# Patient Record
Sex: Male | Born: 1995 | Race: White | Hispanic: No | Marital: Single | Smoking: Current every day smoker
Health system: Southern US, Community
[De-identification: ages and names within clinical notes are randomized; demographics above are authoritative.]

## PROBLEM LIST (undated history)

## (undated) DIAGNOSIS — H539 Unspecified visual disturbance: Secondary | ICD-10-CM

## (undated) DIAGNOSIS — E669 Obesity, unspecified: Secondary | ICD-10-CM

## (undated) DIAGNOSIS — J45909 Unspecified asthma, uncomplicated: Secondary | ICD-10-CM

## (undated) DIAGNOSIS — F909 Attention-deficit hyperactivity disorder, unspecified type: Secondary | ICD-10-CM

## (undated) DIAGNOSIS — F32A Depression, unspecified: Secondary | ICD-10-CM

## (undated) DIAGNOSIS — E785 Hyperlipidemia, unspecified: Principal | ICD-10-CM

## (undated) DIAGNOSIS — T7840XA Allergy, unspecified, initial encounter: Secondary | ICD-10-CM

## (undated) DIAGNOSIS — F329 Major depressive disorder, single episode, unspecified: Secondary | ICD-10-CM

## (undated) DIAGNOSIS — R51 Headache: Secondary | ICD-10-CM

## (undated) HISTORY — DX: Depression, unspecified: F32.A

## (undated) HISTORY — PX: ADENOIDECTOMY: SUR15

## (undated) HISTORY — DX: Hyperlipidemia, unspecified: E78.5

## (undated) HISTORY — PX: TONSILLECTOMY: SUR1361

## (undated) HISTORY — DX: Major depressive disorder, single episode, unspecified: F32.9

---
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
## 2013-05-07 IMAGING — US US SOFT TISSUE HEAD/NECK
1 series · 14 of 25 positions shown · non-contrast
Comparison: None.

CLINICAL DATA: Goiter.

THYROID ULTRASOUND
TECHNIQUE: Ultrasound examination of the thyroid gland and adjacent
soft tissues was performed.

[Series 1: us soft tissue head/neck · 0.07mm/px · 14 of 45 slices shown]
[im 1/45]
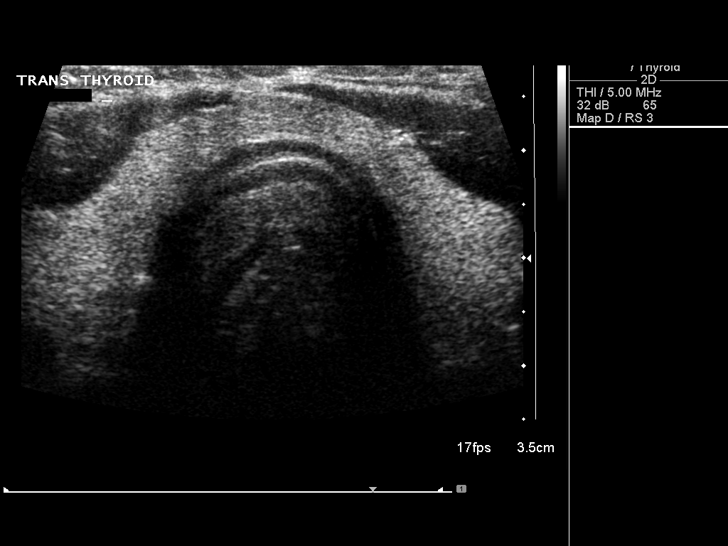
[im 4/45]
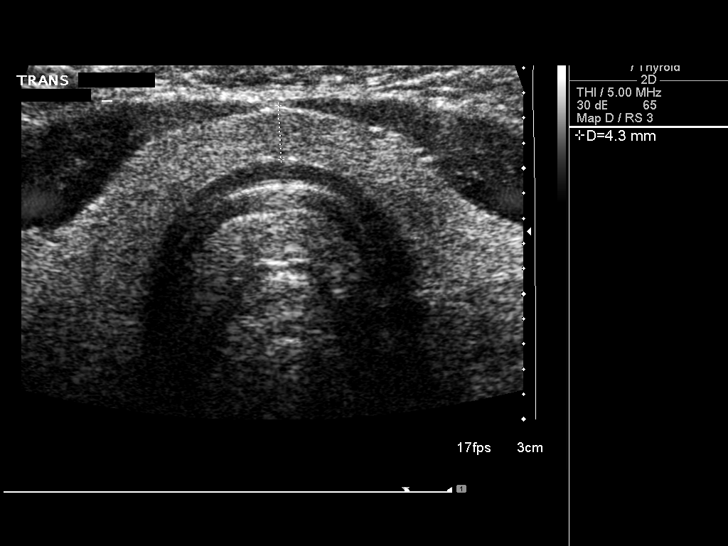
[im 8/45]
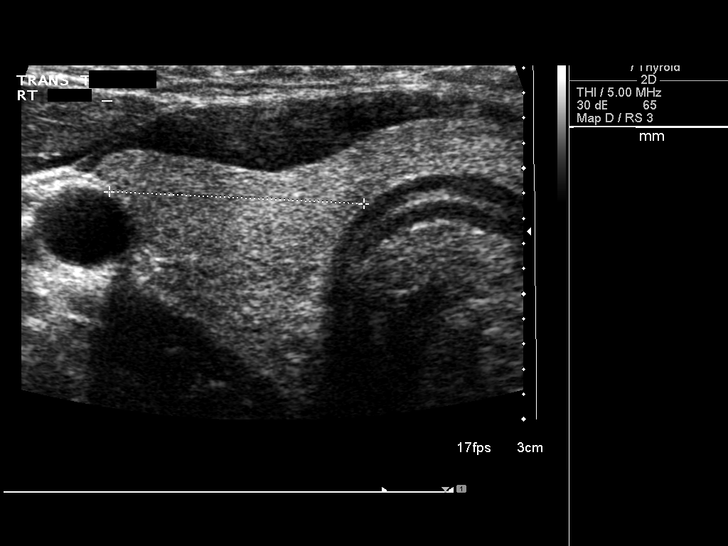
[im 12/45]
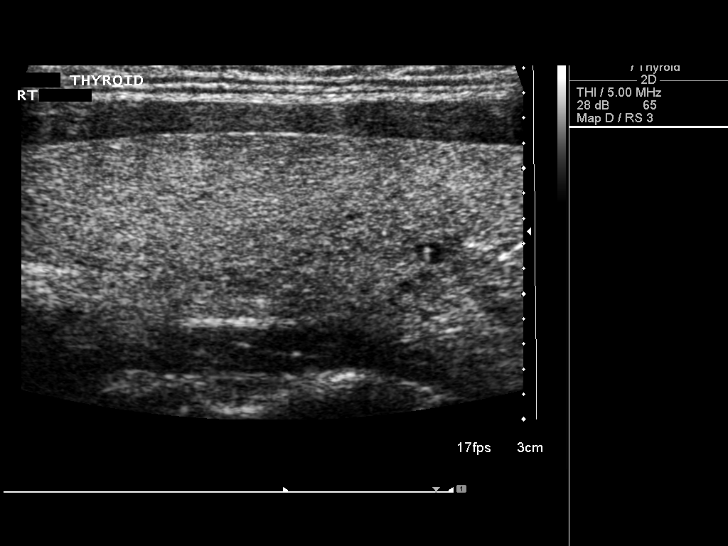
[im 15/45]
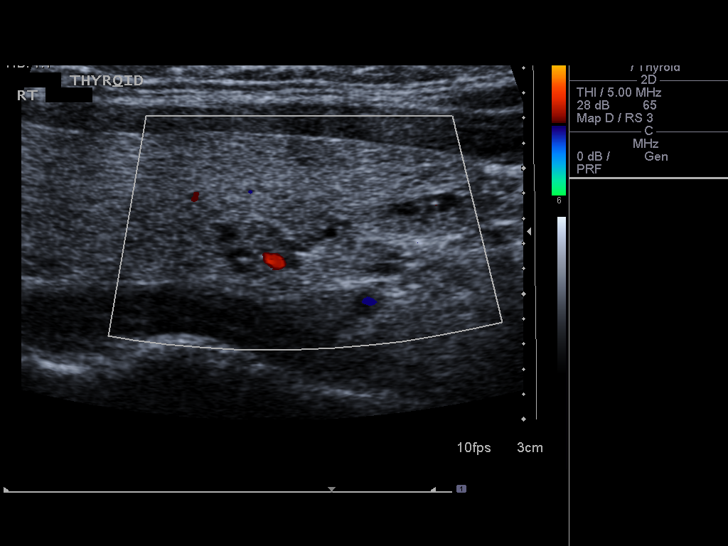
[im 17/45]
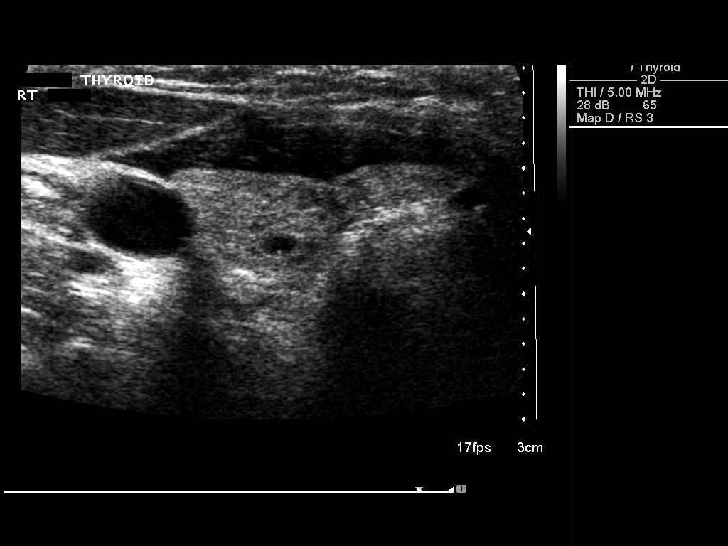
[im 21/45]
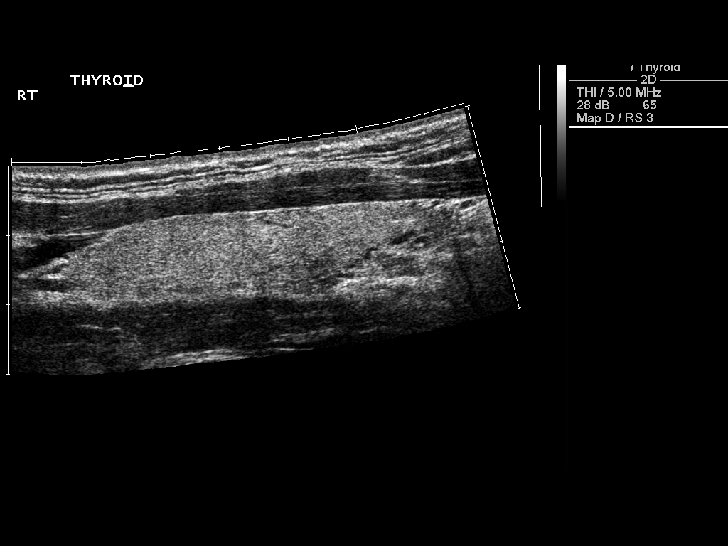
[im 24/45]
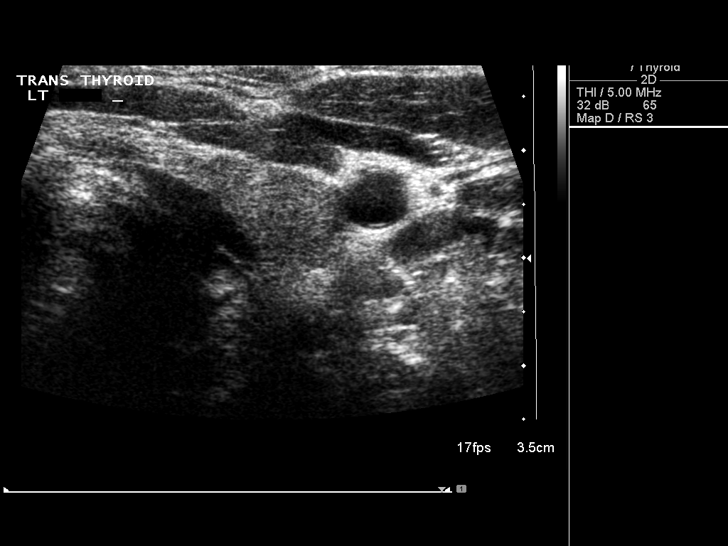
[im 28/45]
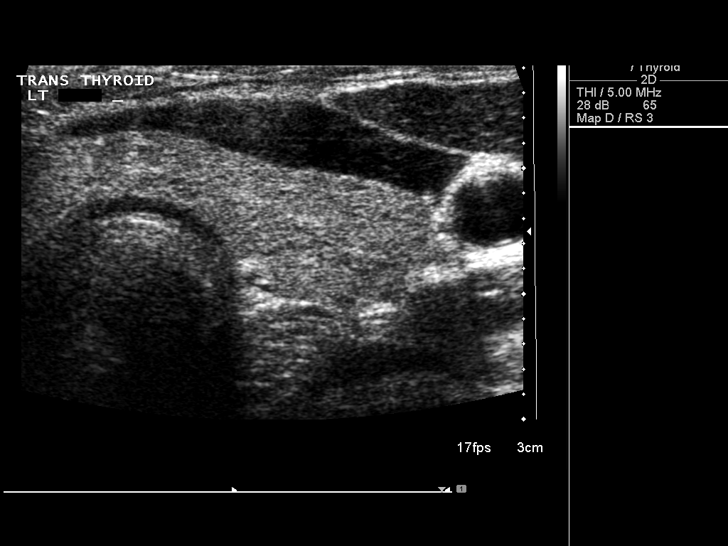
[im 30/45]
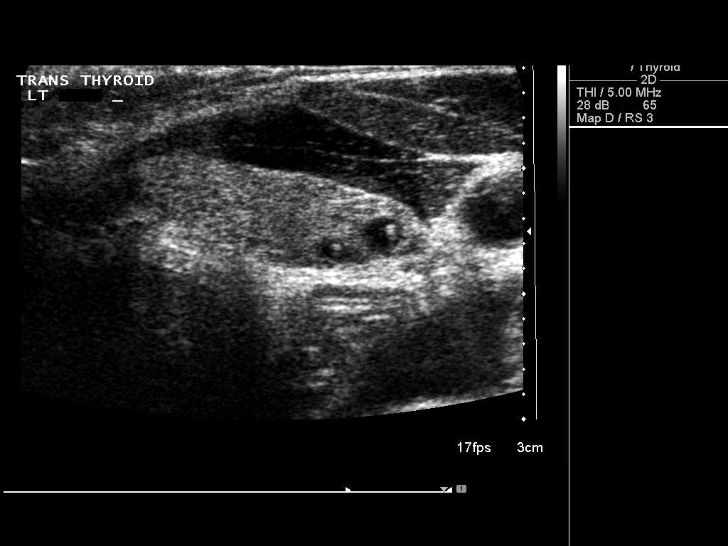
[im 34/45]
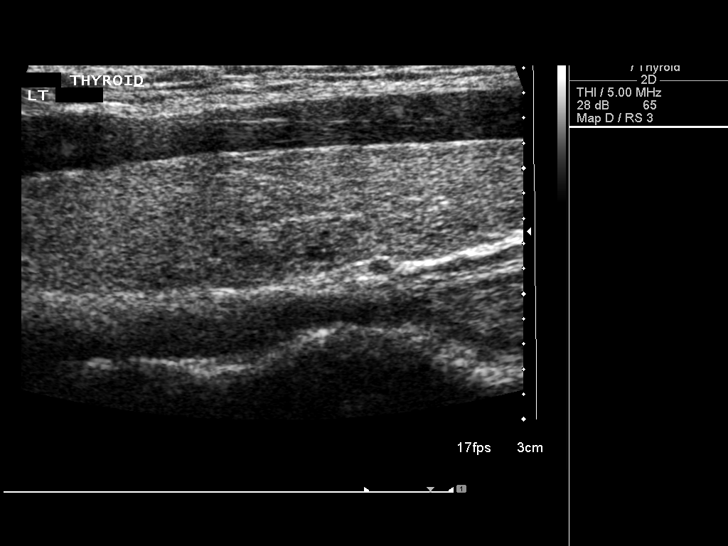
[im 37/45]
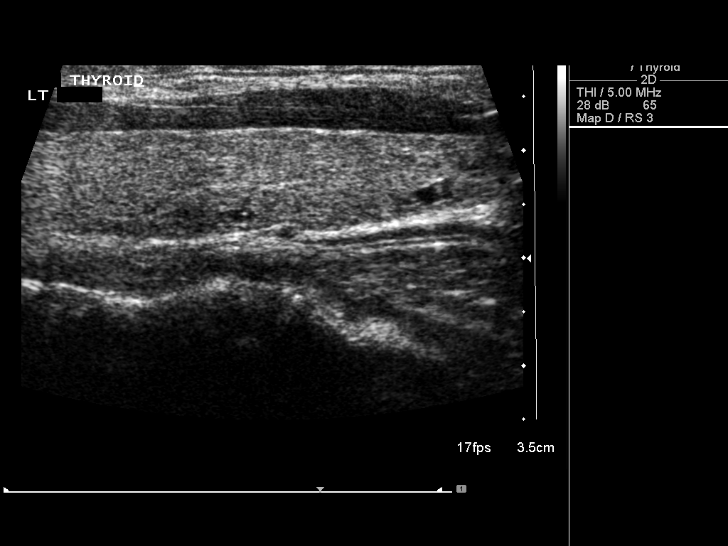
[im 41/45]
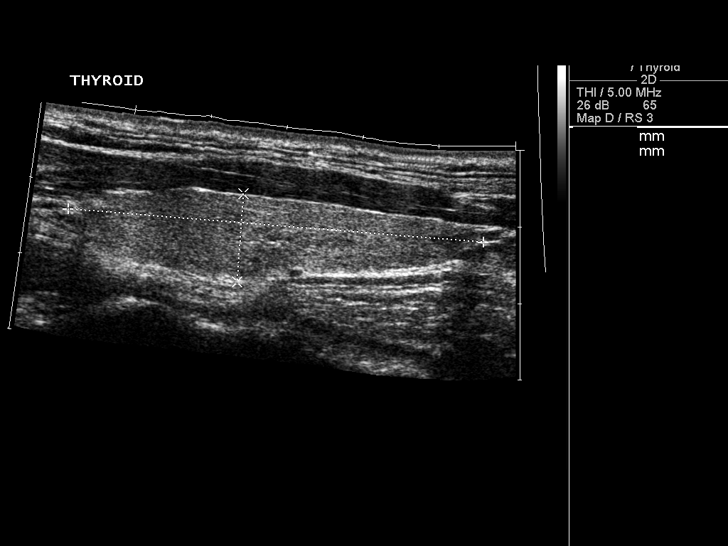
[im 45/45]
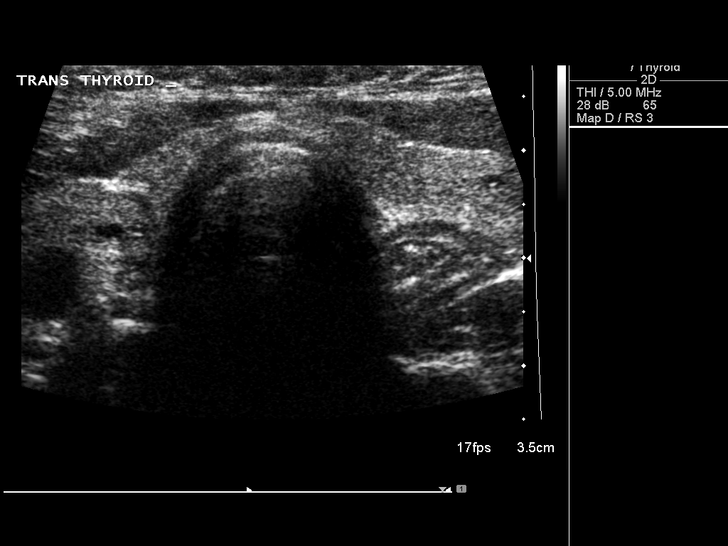

[14 of 25 positions shown; findings below may reference images not displayed]

FINDINGS: Right thyroid lobe:  Measures 5.8 x 1.3 x 2.2 cm and is homogeneous
in echotexture.
Left thyroid lobe:  Measures 5.4 x 1.2 x 1.6 cm and is homogeneous
in echotexture.
Isthmus:  Measures 4 mm.

Focal nodules:  There are scattered cystic nodules in the lower
poles bilaterally, measuring 5 mm or less in size.  No solid
nodules.

Lymphadenopathy:  None visualized.
IMPRESSION: Mildly enlarged thyroid.

## 2014-07-01 ENCOUNTER — Ambulatory Visit: Payer: Self-pay | Admitting: Medical

## 2014-07-07 ENCOUNTER — Telehealth: Payer: Self-pay | Admitting: *Deleted

## 2014-07-07 NOTE — Telephone Encounter (Signed)
Unable to reach patient at time of call- mother answered and stated he was sleeping (just got home from college).

## 2014-07-08 ENCOUNTER — Ambulatory Visit (INDEPENDENT_AMBULATORY_CARE_PROVIDER_SITE_OTHER): Payer: PRIVATE HEALTH INSURANCE | Admitting: Medical

## 2014-07-08 ENCOUNTER — Encounter: Payer: Self-pay | Admitting: Medical

## 2014-07-08 VITALS — BP 137/82 | HR 84 | Temp 98.4°F | Ht 74.75 in | Wt 304.6 lb

## 2014-07-08 DIAGNOSIS — J301 Allergic rhinitis due to pollen: Secondary | ICD-10-CM

## 2014-07-08 DIAGNOSIS — Z0189 Encounter for other specified special examinations: Secondary | ICD-10-CM | POA: Diagnosis not present

## 2014-07-08 DIAGNOSIS — J45998 Other asthma: Secondary | ICD-10-CM | POA: Insufficient documentation

## 2014-07-08 DIAGNOSIS — F32A Depression, unspecified: Secondary | ICD-10-CM

## 2014-07-08 DIAGNOSIS — F329 Major depressive disorder, single episode, unspecified: Secondary | ICD-10-CM | POA: Insufficient documentation

## 2014-07-08 DIAGNOSIS — Z0000 Encounter for general adult medical examination without abnormal findings: Secondary | ICD-10-CM | POA: Insufficient documentation

## 2014-07-08 DIAGNOSIS — G43809 Other migraine, not intractable, without status migrainosus: Secondary | ICD-10-CM

## 2014-07-08 DIAGNOSIS — Z87892 Personal history of anaphylaxis: Secondary | ICD-10-CM

## 2014-07-08 DIAGNOSIS — J309 Allergic rhinitis, unspecified: Secondary | ICD-10-CM | POA: Insufficient documentation

## 2014-07-08 NOTE — Assessment & Plan Note (Signed)
History of migraine ha in past. Used to see Dr.  Asa LenteElliot Lewitt 3 years ago. Pt used to be on Topamax 3 years. Pt migraine ha are random now. Over last year got 4-5 migraine last year. Pt just took a nap and ibuprofen. Pt describes light and sound sensitivity like migraine. Currenlty stable.

## 2014-07-08 NOTE — Assessment & Plan Note (Signed)
hx of asthma. Last flare per pt mom was 2 yrs ago. But that wheezing episode during anaphylactic reaction when exposed to powdered eggs. But otherwise last wheezing episode was about 10 years ago. Pt never saw allergist to investigate what exactly allergy was in the powdered.

## 2014-07-08 NOTE — Assessment & Plan Note (Signed)
Allergies- yr round but mostly in the Spring. Worst with Oak and [place] trees.

## 2014-07-08 NOTE — Assessment & Plan Note (Addendum)
Will put in future labs to get done fasting. Recommend to diet and exercise to loose weight. Will wait on prior provider records to see if you need any vaccines.  On follow up will review again your chronic current conditions before you leave for college and then may prescribe meds. Currently all conditions appear to be stable did not make management changes.  Note new pt and stable conditions. He did not need rx. So documented his prior problems but did wellness exam.

## 2014-07-08 NOTE — Assessment & Plan Note (Signed)
One time to powerdered eggs. I offered referal to allergist pt declined. Mom and pt decline epipen presently.

## 2014-07-08 NOTE — Progress Notes (Signed)
Subjective:    Patient ID: Leonard Obrien, male    DOB: 1995-04-12, 19 y.o.   MRN: [id number]  HPI   I have reviewed pt PMH, PSH, FH, Social History and Surgical History  Pt in with mom.   Allergies- yr round but mostly in the Spring. Worst with Oak and [place] trees.  Asthma- hx of asthma. Last flare per pt mom was 2 yrs ago. But that wheezing episode during anaphylactic reaction when exposed to powdered eggs. But otherwise last wheezing episode was about 10 years ago. Pt never saw allergist to investigate what exactly allergy was in the powdered.  Pt has history of depression- That started around time mom and dad got divorced. Also around time grandad passed away.  Pt is better. Pt used to see a psychiatrist. Last time saw psychiatrist was 2 years ago.   History of migraine ha in past. Used to see Dr.  Asa LenteElliot Obrien 3 years ago. Pt used to be on Topamax 3 years. Pt migraine ha are random now. Over last year got 4-5 migraine last year. Pt just took a nap and ibuprofen. Pt describes light and sound sensitivity like migraine.   ADHD- Pt diagnosed 19 yo old. Pt used to be on ritalin, concerta, and  Adderal. Pt stopped med about as sophomore in high school. He felt like zombie and he could not drive.  [employer], Pt is studying education, sophomore now, Pt walks a lot at school, 2-3 sodas a day, fried foods/junk food, single.  Pt not any medication presently. Pt starts back summer school in June.  Pt was with pediatrician up to recenlty. Mom has expectations that he is up to date on all vaccines. So will wait until all.     Review of Systems  Constitutional: Negative for fever, chills and fatigue.  HENT: Negative.   Respiratory: Negative for cough, chest tightness, shortness of breath and wheezing.   Cardiovascular: Negative for chest pain and palpitations.  Gastrointestinal: Negative for abdominal pain, diarrhea, constipation, blood in stool and anal bleeding.  Endocrine:  Negative for polydipsia, polyphagia and polyuria.  Skin: Negative for color change, pallor and rash.  Neurological: Negative for dizziness, tremors, seizures, syncope, speech difficulty, weakness, light-headedness, numbness and headaches.  Hematological: Negative for adenopathy. Does not bruise/bleed easily.  Psychiatric/Behavioral: Negative for behavioral problems and confusion.       Mood stable recently  Attention controlled.    Past Medical History  Diagnosis Date  . Asthma   . Allergy   . ADHD (attention deficit hyperactivity disorder)   . Headache(784.0)   . Vision abnormalities   . Obesity   . Depression     History   Social History  . Marital Status: Single    Spouse Name: N/A  . Number of Children: N/A  . Years of Education: N/A   Occupational History  . Student     rising 12th grade at [employer]   Social History Main Topics  . Smoking status: Passive Smoke Exposure - Never Smoker  . Smokeless tobacco: Never Used  . Alcohol Use: 7.2 oz/week    0 Standard drinks or equivalent, 12 Cans of beer per week     Comment: 1-2 times a month. Binges large amount approximate 12 beers some liquor.  . Drug Use: No  . Sexual Activity:    Partners: Female    Birth Control/ Protection: Condom     Comment: Pt reports girlfriend has genital herpes, would like to be  tested   Other Topics Concern  . Not on file   Social History Narrative    Past Surgical History  Procedure Laterality Date  . Tonsillectomy    . Adenoidectomy      Family History  Problem Relation Age of Onset  . Fibromyalgia Mother   . Arthritis Mother   . Migraines Mother   . Heart disease Father   . Diabetes Father   . Cancer Father   . Migraines Father     Allergies  Allergen Reactions  . Latex Itching    rash  . Onion   . Sulfa Antibiotics Rash    No current outpatient prescriptions on file prior to visit.   No current facility-administered medications on file prior to visit.     BP 137/82 mmHg  Pulse 84  Temp(Src) 98.4 F (36.9 C) (Oral)  Ht 6' 2.75" (1.899 m)  Wt 304 lb 9.6 oz (138.166 kg)  BMI 38.31 kg/m2  SpO2 97%        Objective:   Physical Exam   General Mental Status- Alert. Orientation- Oriented x3.  Build and Nutrition- Well nourished and Well Developed.  Skin General:-Normal. Color- Normal color. Moisture- Normal. Temperature-Warm.  HEENT  Ears- Normal. Auditory Canal- Bilateral-Normal. Tympanic Membrane- Bilateral-Normal. .Nose & Sinuses- Normal. Nostrils-Bilateral- Normal. Mouth & Throat-Normal.  Neck Neck- No Bruits or Masses. Trachea midline.  Thyroid- Normal.  Chest and Lung Exam Percussion: Quality and Intensity-Percussion normal. Percussion of the chest reveals- No Dullness.  Palpation: Palpation of the chest reveals- Non-tender- No dullness. Auscultation: Breath Sounds- Normal.  CTA Adventitous Sounds:-No adventitious sounds.  Cardiovascular Inspection:- No Heaves. Auscultation:-Normal sinus rhythm(RRR) without murmur gallop, S1 WNL and S2 WNL.  Abdomen Inspection:-Inspection Normal. Inspection of the abdomen reveals- No hernias Palpation/Percussion:- Palpation and Percussion of the Abdomen reveal- Non Tender and No Palpable abdominal masses. Liver: Other Characteristics- No hepatomegaly. Spleen:Other Characteristics- No Splenomegaly. Auscultation:- Auscultation of the abdomen reveals- Bowel sounds normal and No Abdominal bruits.  Male Genitourinary Urethra:- No discharge. Penis- Circumcised. Scrotum- No masses. Testes- Bilateral-Normal.  .  Peripheral  Vascular Lower Extremity:Inspection- Bilateral-Inspection Normal    Neurologic Mental Status:- Normal. Cranial Nerves:-Normal Bilaterally. Motor:-Normal. Strength:5/5 normal muscle strength-All Muscles.   Meningeal Signs- None.  Musculoskeletal Global Assessment General-Joints show full range of motion without obvious deformity and Normal muscle  mass. Strength in upper and lower extremities.  Lymphatics General lymphatics Description- No generalized lymphadenopathy.     Assessment & Plan:

## 2014-07-08 NOTE — Patient Instructions (Signed)
Wellness examination Will put in future labs to get done fasting. Recommend to diet and exercise to loose weight. Will wait on prior provider records to see if you need any vaccines.  On follow up will review again your chronic current conditions before you leave for college and then may prescribe meds. Currently all conditions appear to be stable.    Preventive Care for Adults A healthy lifestyle and preventive care can promote health and wellness. Preventive health guidelines for men include the following key practices:  A routine yearly physical is a good way to check with your health care provider about your health and preventative screening. It is a chance to share any concerns and updates on your health and to receive a thorough exam.  Visit your dentist for a routine exam and preventative care every 6 months. Brush your teeth twice a day and floss once a day. Good oral hygiene prevents tooth decay and gum disease.  The frequency of eye exams is based on your age, health, family medical history, use of contact lenses, and other factors. Follow your health care provider's recommendations for frequency of eye exams.  Eat a healthy diet. Foods such as vegetables, fruits, whole grains, low-fat dairy products, and lean protein foods contain the nutrients you need without too many calories. Decrease your intake of foods high in solid fats, added sugars, and salt. Eat the right amount of calories for you.Get information about a proper diet from your health care provider, if necessary.  Regular physical exercise is one of the most important things you can do for your health. Most adults should get at least 150 minutes of moderate-intensity exercise (any activity that increases your heart rate and causes you to sweat) each week. In addition, most adults need muscle-strengthening exercises on 2 or more days a week.  Maintain a healthy weight. The body mass index (BMI) is a screening tool to identify  possible weight problems. It provides an estimate of body fat based on height and weight. Your health care provider can find your BMI and can help you achieve or maintain a healthy weight.For adults 20 years and older:  A BMI below 18.5 is considered underweight.  A BMI of 18.5 to 24.9 is normal.  A BMI of 25 to 29.9 is considered overweight.  A BMI of 30 and above is considered obese.  Maintain normal blood lipids and cholesterol levels by exercising and minimizing your intake of saturated fat. Eat a balanced diet with plenty of fruit and vegetables. Blood tests for lipids and cholesterol should begin at age 69 and be repeated every 5 years. If your lipid or cholesterol levels are high, you are over 50, or you are at high risk for heart disease, you may need your cholesterol levels checked more frequently.Ongoing high lipid and cholesterol levels should be treated with medicines if diet and exercise are not working.  If you smoke, find out from your health care provider how to quit. If you do not use tobacco, do not start.  Lung cancer screening is recommended for adults aged 4-80 years who are at high risk for developing lung cancer because of a history of smoking. A yearly low-dose CT scan of the lungs is recommended for people who have at least a 30-pack-year history of smoking and are a current smoker or have quit within the past 15 years. A pack year of smoking is smoking an average of 1 pack of cigarettes a day for 1 year (for example:  1 pack a day for 30 years or 2 packs a day for 15 years). Yearly screening should continue until the smoker has stopped smoking for at least 15 years. Yearly screening should be stopped for people who develop a health problem that would prevent them from having lung cancer treatment.  If you choose to drink alcohol, do not have more than 2 drinks per day. One drink is considered to be 12 ounces (355 mL) of beer, 5 ounces (148 mL) of wine, or 1.5 ounces (44  mL) of liquor.  Avoid use of street drugs. Do not share needles with anyone. Ask for help if you need support or instructions about stopping the use of drugs.  High blood pressure causes heart disease and increases the risk of stroke. Your blood pressure should be checked at least every 1-2 years. Ongoing high blood pressure should be treated with medicines, if weight loss and exercise are not effective.  If you are 43-85 years old, ask your health care provider if you should take aspirin to prevent heart disease.  Diabetes screening involves taking a blood sample to check your fasting blood sugar level. This should be done once every 3 years, after age 35, if you are within normal weight and without risk factors for diabetes. Testing should be considered at a younger age or be carried out more frequently if you are overweight and have at least 1 risk factor for diabetes.  Colorectal cancer can be detected and often prevented. Most routine colorectal cancer screening begins at the age of 65 and continues through age 3. However, your health care provider may recommend screening at an earlier age if you have risk factors for colon cancer. On a yearly basis, your health care provider may provide home test kits to check for hidden blood in the stool. Use of a small camera at the end of a tube to directly examine the colon (sigmoidoscopy or colonoscopy) can detect the earliest forms of colorectal cancer. Talk to your health care provider about this at age 104, when routine screening begins. Direct exam of the colon should be repeated every 5-10 years through age 6, unless early forms of precancerous polyps or small growths are found.  People who are at an increased risk for hepatitis B should be screened for this virus. You are considered at high risk for hepatitis B if:  You were born in a country where hepatitis B occurs often. Talk with your health care provider about which countries are considered high  risk.  Your parents were born in a high-risk country and you have not received a shot to protect against hepatitis B (hepatitis B vaccine).  You have HIV or AIDS.  You use needles to inject street drugs.  You live with, or have sex with, someone who has hepatitis B.  You are a man who has sex with other men (MSM).  You get hemodialysis treatment.  You take certain medicines for conditions such as cancer, organ transplantation, and autoimmune conditions.  Hepatitis C blood testing is recommended for all people born from [birth year] through 1965 and any individual with known risks for hepatitis C.  Practice safe sex. Use condoms and avoid high-risk sexual practices to reduce the spread of sexually transmitted infections (STIs). STIs include gonorrhea, chlamydia, syphilis, trichomonas, herpes, HPV, and human immunodeficiency virus (HIV). Herpes, HIV, and HPV are viral illnesses that have no cure. They can result in disability, cancer, and death.  If you are at risk of being  infected with HIV, it is recommended that you take a prescription medicine daily to prevent HIV infection. This is called preexposure prophylaxis (PrEP). You are considered at risk if:  You are a man who has sex with other men (MSM) and have other risk factors.  You are a heterosexual man, are sexually active, and are at increased risk for HIV infection.  You take drugs by injection.  You are sexually active with a partner who has HIV.  Talk with your health care provider about whether you are at high risk of being infected with HIV. If you choose to begin PrEP, you should first be tested for HIV. You should then be tested every 3 months for as long as you are taking PrEP.  A one-time screening for abdominal aortic aneurysm (AAA) and surgical repair of large AAAs by ultrasound are recommended for men ages 46 to 6 years who are current or former smokers.  Healthy men should no longer receive prostate-specific antigen (PSA)  blood tests as part of routine cancer screening. Talk with your health care provider about prostate cancer screening.  Testicular cancer screening is not recommended for adult males who have no symptoms. Screening includes self-exam, a health care provider exam, and other screening tests. Consult with your health care provider about any symptoms you have or any concerns you have about testicular cancer.  Use sunscreen. Apply sunscreen liberally and repeatedly throughout the day. You should seek shade when your shadow is shorter than you. Protect yourself by wearing long sleeves, pants, a wide-brimmed hat, and sunglasses year round, whenever you are outdoors.  Once a month, do a whole-body skin exam, using a mirror to look at the skin on your back. Tell your health care provider about new moles, moles that have irregular borders, moles that are larger than a pencil eraser, or moles that have changed in shape or color.  Stay current with required vaccines (immunizations).  Influenza vaccine. All adults should be immunized every year.  Tetanus, diphtheria, and acellular pertussis (Td, Tdap) vaccine. An adult who has not previously received Tdap or who does not know his vaccine status should receive 1 dose of Tdap. This initial dose should be followed by tetanus and diphtheria toxoids (Td) booster doses every 10 years. Adults with an unknown or incomplete history of completing a 3-dose immunization series with Td-containing vaccines should begin or complete a primary immunization series including a Tdap dose. Adults should receive a Td booster every 10 years.  Varicella vaccine. An adult without evidence of immunity to varicella should receive 2 doses or a second dose if he has previously received 1 dose.  Human papillomavirus (HPV) vaccine. Males aged 68-21 years who have not received the vaccine previously should receive the 3-dose series. Males aged 22-26 years may be immunized. Immunization is  recommended through the age of 19 years for any male who has sex with males and did not get any or all doses earlier. Immunization is recommended for any person with an immunocompromised condition through the age of 35 years if he did not get any or all doses earlier. During the 3-dose series, the second dose should be obtained 4-8 weeks after the first dose. The third dose should be obtained 24 weeks after the first dose and 16 weeks after the second dose.  Zoster vaccine. One dose is recommended for adults aged 56 years or older unless certain conditions are present.  Measles, mumps, and rubella (MMR) vaccine. Adults born before 40 generally are  considered immune to measles and mumps. Adults born in [birth year] or later should have 1 or more doses of MMR vaccine unless there is a contraindication to the vaccine or there is laboratory evidence of immunity to each of the three diseases. A routine second dose of MMR vaccine should be obtained at least 28 days after the first dose for students attending postsecondary schools, health care workers, or international travelers. People who received inactivated measles vaccine or an unknown type of measles vaccine during 1963-1967 should receive 2 doses of MMR vaccine. People who received inactivated mumps vaccine or an unknown type of mumps vaccine before 1979 and are at high risk for mumps infection should consider immunization with 2 doses of MMR vaccine. Unvaccinated health care workers born before [birth year] who lack laboratory evidence of measles, mumps, or rubella immunity or laboratory confirmation of disease should consider measles and mumps immunization with 2 doses of MMR vaccine or rubella immunization with 1 dose of MMR vaccine.  Pneumococcal 13-valent conjugate (PCV13) vaccine. When indicated, a person who is uncertain of his immunization history and has no record of immunization should receive the PCV13 vaccine. An adult aged 85 years or older who has certain  medical conditions and has not been previously immunized should receive 1 dose of PCV13 vaccine. This PCV13 should be followed with a dose of pneumococcal polysaccharide (PPSV23) vaccine. The PPSV23 vaccine dose should be obtained at least 8 weeks after the dose of PCV13 vaccine. An adult aged 12 years or older who has certain medical conditions and previously received 1 or more doses of PPSV23 vaccine should receive 1 dose of PCV13. The PCV13 vaccine dose should be obtained 1 or more years after the last PPSV23 vaccine dose.  Pneumococcal polysaccharide (PPSV23) vaccine. When PCV13 is also indicated, PCV13 should be obtained first. All adults aged 71 years and older should be immunized. An adult younger than age 58 years who has certain medical conditions should be immunized. Any person who resides in a nursing home or long-term care facility should be immunized. An adult smoker should be immunized. People with an immunocompromised condition and certain other conditions should receive both PCV13 and PPSV23 vaccines. People with human immunodeficiency virus (HIV) infection should be immunized as soon as possible after diagnosis. Immunization during chemotherapy or radiation therapy should be avoided. Routine use of PPSV23 vaccine is not recommended for American Indians, [place] Natives, or people younger than 65 years unless there are medical conditions that require PPSV23 vaccine. When indicated, people who have unknown immunization and have no record of immunization should receive PPSV23 vaccine. One-time revaccination 5 years after the first dose of PPSV23 is recommended for people aged 19-64 years who have chronic kidney failure, nephrotic syndrome, asplenia, or immunocompromised conditions. People who received 1-2 doses of PPSV23 before age 10 years should receive another dose of PPSV23 vaccine at age 39 years or later if at least 5 years have passed since the previous dose. Doses of PPSV23 are not needed for  people immunized with PPSV23 at or after age 65 years.  Meningococcal vaccine. Adults with asplenia or persistent complement component deficiencies should receive 2 doses of quadrivalent meningococcal conjugate (MenACWY-D) vaccine. The doses should be obtained at least 2 months apart. Microbiologists working with certain meningococcal bacteria, [employer] recruits, people at risk during an outbreak, and people who travel to or live in countries with a high rate of meningitis should be immunized. A first-year college student up through age 35 years who is living  in a residence hall should receive a dose if he did not receive a dose on or after his 16th birthday. Adults who have certain high-risk conditions should receive one or more doses of vaccine.  Hepatitis A vaccine. Adults who wish to be protected from this disease, have certain high-risk conditions, work with hepatitis A-infected animals, work in hepatitis A research labs, or travel to or work in countries with a high rate of hepatitis A should be immunized. Adults who were previously unvaccinated and who anticipate close contact with an international adoptee during the first 60 days after arrival in the [place] from a country with a high rate of hepatitis A should be immunized.  Hepatitis B vaccine. Adults should be immunized if they wish to be protected from this disease, have certain high-risk conditions, may be exposed to blood or other infectious body fluids, are household contacts or sex partners of hepatitis B positive people, are clients or workers in certain care facilities, or travel to or work in countries with a high rate of hepatitis B.  Haemophilus influenzae type b (Hib) vaccine. A previously unvaccinated person with asplenia or sickle cell disease or having a scheduled splenectomy should receive 1 dose of Hib vaccine. Regardless of previous immunization, a recipient of a hematopoietic stem cell transplant should receive a 3-dose  series 6-12 months after his successful transplant. Hib vaccine is not recommended for adults with HIV infection. Preventive Service / Frequency Ages 5 to 73  Blood pressure check.** / Every 1 to 2 years.  Lipid and cholesterol check.** / Every 5 years beginning at age 71.  Hepatitis C blood test.** / For any individual with known risks for hepatitis C.  Skin self-exam. / Monthly.  Influenza vaccine. / Every year.  Tetanus, diphtheria, and acellular pertussis (Tdap, Td) vaccine.** / Consult your health care provider. 1 dose of Td every 10 years.  Varicella vaccine.** / Consult your health care provider.  HPV vaccine. / 3 doses over 6 months, if 57 or younger.  Measles, mumps, rubella (MMR) vaccine.** / You need at least 1 dose of MMR if you were born in 1957 or later. You may also need a second dose.  Pneumococcal 13-valent conjugate (PCV13) vaccine.** / Consult your health care provider.  Pneumococcal polysaccharide (PPSV23) vaccine.** / 1 to 2 doses if you smoke cigarettes or if you have certain conditions.  Meningococcal vaccine.** / 1 dose if you are age 10 to 20 years and a Market researcher living in a residence hall, or have one of several medical conditions. You may also need additional booster doses.  Hepatitis A vaccine.** / Consult your health care provider.  Hepatitis B vaccine.** / Consult your health care provider.  Haemophilus influenzae type b (Hib) vaccine.** / Consult your health care provider. Ages 9 to 37  Blood pressure check.** / Every 1 to 2 years.  Lipid and cholesterol check.** / Every 5 years beginning at age 28.  Lung cancer screening. / Every year if you are aged 19-80 years and have a 30-pack-year history of smoking and currently smoke or have quit within the past 15 years. Yearly screening is stopped once you have quit smoking for at least 15 years or develop a health problem that would prevent you from having lung cancer  treatment.  Fecal occult blood test (FOBT) of stool. / Every year beginning at age 28 and continuing until age 44. You may not have to do this test if you get a colonoscopy every  10 years.  Flexible sigmoidoscopy** or colonoscopy.** / Every 5 years for a flexible sigmoidoscopy or every 10 years for a colonoscopy beginning at age 27 and continuing until age 45.  Hepatitis C blood test.** / For all people born from [birth year] through 1965 and any individual with known risks for hepatitis C.  Skin self-exam. / Monthly.  Influenza vaccine. / Every year.  Tetanus, diphtheria, and acellular pertussis (Tdap/Td) vaccine.** / Consult your health care provider. 1 dose of Td every 10 years.  Varicella vaccine.** / Consult your health care provider.  Zoster vaccine.** / 1 dose for adults aged 52 years or older.  Measles, mumps, rubella (MMR) vaccine.** / You need at least 1 dose of MMR if you were born in 1957 or later. You may also need a second dose.  Pneumococcal 13-valent conjugate (PCV13) vaccine.** / Consult your health care provider.  Pneumococcal polysaccharide (PPSV23) vaccine.** / 1 to 2 doses if you smoke cigarettes or if you have certain conditions.  Meningococcal vaccine.** / Consult your health care provider.  Hepatitis A vaccine.** / Consult your health care provider.  Hepatitis B vaccine.** / Consult your health care provider.  Haemophilus influenzae type b (Hib) vaccine.** / Consult your health care provider. Ages 101 and over  Blood pressure check.** / Every 1 to 2 years.  Lipid and cholesterol check.**/ Every 5 years beginning at age 26.  Lung cancer screening. / Every year if you are aged 57-80 years and have a 30-pack-year history of smoking and currently smoke or have quit within the past 15 years. Yearly screening is stopped once you have quit smoking for at least 15 years or develop a health problem that would prevent you from having lung cancer treatment.  Fecal occult  blood test (FOBT) of stool. / Every year beginning at age 88 and continuing until age 69. You may not have to do this test if you get a colonoscopy every 10 years.  Flexible sigmoidoscopy** or colonoscopy.** / Every 5 years for a flexible sigmoidoscopy or every 10 years for a colonoscopy beginning at age 52 and continuing until age 14.  Hepatitis C blood test.** / For all people born from [birth year] through 1967 and any individual with known risks for hepatitis C.  Abdominal aortic aneurysm (AAA) screening.** / A one-time screening for ages 64 to 35 years who are current or former smokers.  Skin self-exam. / Monthly.  Influenza vaccine. / Every year.  Tetanus, diphtheria, and acellular pertussis (Tdap/Td) vaccine.** / 1 dose of Td every 10 years.  Varicella vaccine.** / Consult your health care provider.  Zoster vaccine.** / 1 dose for adults aged 29 years or older.  Pneumococcal 13-valent conjugate (PCV13) vaccine.** / Consult your health care provider.  Pneumococcal polysaccharide (PPSV23) vaccine.** / 1 dose for all adults aged 27 years and older.  Meningococcal vaccine.** / Consult your health care provider.  Hepatitis A vaccine.** / Consult your health care provider.  Hepatitis B vaccine.** / Consult your health care provider.  Haemophilus influenzae type b (Hib) vaccine.** / Consult your health care provider. **Family history and personal history of risk and conditions may change your health care provider's recommendations. Document Released: 04/12/2001 Document Revised: 02/19/2013 Document Reviewed: 07/12/2010 [facility] Patient Information 2015 [place], [place]. This information is not intended to replace advice given to you by your health care provider. Make sure you discuss any questions you have with your health care provider.

## 2014-07-08 NOTE — Progress Notes (Signed)
Pre visit review using our clinic review tool, if applicable. No additional management support is needed unless otherwise documented below in the visit note. 

## 2014-07-08 NOTE — Assessment & Plan Note (Signed)
Pt has history of depression- That started around time mom and dad got divorced. Also around time grandad passed away.  Pt is better. Pt used to see a psychiatrist. Last time saw psychiatrist was 2 years ago. Recently stable.

## 2014-07-10 ENCOUNTER — Other Ambulatory Visit (INDEPENDENT_AMBULATORY_CARE_PROVIDER_SITE_OTHER): Payer: PRIVATE HEALTH INSURANCE

## 2014-07-10 DIAGNOSIS — Z0189 Encounter for other specified special examinations: Secondary | ICD-10-CM

## 2014-07-10 DIAGNOSIS — Z0000 Encounter for general adult medical examination without abnormal findings: Secondary | ICD-10-CM | POA: Diagnosis not present

## 2014-07-10 LAB — COMPREHENSIVE METABOLIC PANEL
ALT: 56 U/L — AB (ref 0–53)
AST: 28 U/L (ref 0–37)
Albumin: 4.6 g/dL (ref 3.5–5.2)
Alkaline Phosphatase: 62 U/L (ref 39–117)
BILIRUBIN TOTAL: 0.7 mg/dL (ref 0.2–1.2)
BUN: 17 mg/dL (ref 6–23)
CHLORIDE: 102 meq/L (ref 96–112)
CO2: 26 meq/L (ref 19–32)
CREATININE: 0.94 mg/dL (ref 0.40–1.50)
Calcium: 9.9 mg/dL (ref 8.4–10.5)
GFR: 107.97 mL/min (ref >60.00)
Glucose, Bld: 86 mg/dL (ref 70–99)
Potassium: 3.9 mEq/L (ref 3.5–5.1)
Sodium: 136 mEq/L (ref 135–145)
Total Protein: 7.5 g/dL (ref 6.0–8.3)

## 2014-07-10 LAB — CBC WITH DIFFERENTIAL/PLATELET
BASOS ABS: 0.1 10*3/uL (ref 0.0–0.1)
Basophils Relative: 1 % (ref 0.0–3.0)
EOS ABS: 1 10*3/uL — AB (ref 0.0–0.7)
Eosinophils Relative: 11 % — ABNORMAL HIGH (ref 0.0–5.0)
HEMATOCRIT: 51.5 % (ref 39.0–52.0)
HEMOGLOBIN: 17.6 g/dL — AB (ref 13.0–17.0)
LYMPHS ABS: 3 10*3/uL (ref 0.7–4.0)
Lymphocytes Relative: 32.4 % (ref 12.0–46.0)
MCHC: 34.3 g/dL (ref 30.0–36.0)
MCV: 86.5 fl (ref 78.0–100.0)
MONO ABS: 0.7 10*3/uL (ref 0.1–1.0)
MONOS PCT: 7.9 % (ref 3.0–12.0)
Neutro Abs: 4.4 10*3/uL (ref 1.4–7.7)
Neutrophils Relative %: 47.7 % (ref 43.0–77.0)
Platelets: 281 10*3/uL (ref 150.0–400.0)
RBC: 5.95 Mil/uL — ABNORMAL HIGH (ref 4.22–5.81)
RDW: 13.2 % (ref 11.5–14.6)
WBC: 9.2 10*3/uL (ref 4.5–10.5)

## 2014-07-10 LAB — LIPID PANEL
CHOL/HDL RATIO: 7
Cholesterol: 179 mg/dL (ref 0–200)
HDL: 25.3 mg/dL — AB (ref >39.00)
LDL Cholesterol: 126 mg/dL — ABNORMAL HIGH (ref 0–99)
NONHDL: 153.7
TRIGLYCERIDES: 140 mg/dL (ref 0.0–149.0)
VLDL: 28 mg/dL (ref 0.0–40.0)

## 2014-07-10 LAB — TSH: TSH: 4.89 u[IU]/mL (ref 0.35–5.50)

## 2014-07-29 ENCOUNTER — Ambulatory Visit: Payer: Self-pay | Admitting: Medical

## 2014-07-30 ENCOUNTER — Encounter: Payer: Self-pay | Admitting: Medical

## 2014-07-30 ENCOUNTER — Ambulatory Visit (INDEPENDENT_AMBULATORY_CARE_PROVIDER_SITE_OTHER): Payer: PRIVATE HEALTH INSURANCE | Admitting: Medical

## 2014-07-30 VITALS — BP 132/85 | HR 100 | Temp 98.4°F | Ht 74.75 in | Wt 305.2 lb

## 2014-07-30 DIAGNOSIS — E785 Hyperlipidemia, unspecified: Secondary | ICD-10-CM

## 2014-07-30 DIAGNOSIS — Z87892 Personal history of anaphylaxis: Secondary | ICD-10-CM

## 2014-07-30 DIAGNOSIS — E669 Obesity, unspecified: Secondary | ICD-10-CM

## 2014-07-30 DIAGNOSIS — L708 Other acne: Secondary | ICD-10-CM

## 2014-07-30 DIAGNOSIS — F329 Major depressive disorder, single episode, unspecified: Secondary | ICD-10-CM | POA: Diagnosis not present

## 2014-07-30 DIAGNOSIS — F32A Depression, unspecified: Secondary | ICD-10-CM

## 2014-07-30 DIAGNOSIS — L709 Acne, unspecified: Secondary | ICD-10-CM | POA: Insufficient documentation

## 2014-07-30 HISTORY — DX: Hyperlipidemia, unspecified: E78.5

## 2014-07-30 MED ORDER — DOXYCYCLINE HYCLATE 100 MG PO TABS: 100.0000 mg | ORAL_TABLET | Freq: Two times a day (BID) | ORAL | Status: DC

## 2014-07-30 MED ORDER — EPINEPHRINE 0.3 MG/0.3ML IJ SOAJ: 0.3000 mg | Freq: Once | INTRAMUSCULAR | Status: DC

## 2014-07-30 NOTE — Assessment & Plan Note (Signed)
Still controlled today.

## 2014-07-30 NOTE — Progress Notes (Signed)
   Subjective:    Patient ID: Leonard Obrien, male    DOB: 1995-06-23, 19 y.o.   MRN: [id number]  HPI  Lipid mild elevated ldl on labs done for wellness. Otherwise only mild abnormalities.  He  is smoker but only vaping now. Pt last time had cigarette was 2 years ago.  Pt has not been exercising. Pt will be exercising after June 27th. He will go back to college then. Has PE type class early in the am.  Pt has scattered acne on his back for a while. Pt states on and off for a while. Some scarring.  See update on problem list. Reviewed all conditions today since he is new patient.        Review of Systems  Constitutional: Negative for fever, chills and fatigue.  HENT: Negative for congestion, drooling, ear pain, postnasal drip, sinus pressure, sore throat and trouble swallowing.   Respiratory: Negative for cough, chest tightness, shortness of breath and wheezing.   Cardiovascular: Negative for chest pain and palpitations.  Musculoskeletal: Negative for myalgias, back pain, joint swelling, gait problem and neck stiffness.  Skin: Negative for rash.       acne  Neurological: Negative for dizziness, speech difficulty, weakness, numbness and headaches.  Hematological: Negative for adenopathy. Does not bruise/bleed easily.  Psychiatric/Behavioral: Negative for behavioral problems and decreased concentration.       Objective:   Physical Exam  General- No acute distress. Pleasant patient. Neck- Full range of motion, no jvd Lungs- Clear, even and unlabored. Heart- regular rate and rhythm. Neurologic- CNII- XII grossly intact.  Skin- scattered pimples/acnes.with scattered scarring       Assessment & Plan:

## 2014-07-30 NOTE — Assessment & Plan Note (Signed)
Currently no symptoms and in past he stated various medications made him feel like a zombie.

## 2014-07-30 NOTE — Progress Notes (Signed)
Pre visit review using our clinic review tool, if applicable. No additional management support is needed unless otherwise documented below in the visit note. 

## 2014-07-30 NOTE — Assessment & Plan Note (Addendum)
Discussed with pt today. Pt still does not want to see allergist. But on description of his remote location. No local MD. Limted hour of his school clinic and local hospital closed, I did decide to rx epi-pen. Explained clinical situation when this would be indicated.

## 2014-07-30 NOTE — Patient Instructions (Addendum)
Asthma in remission Pt still breathing well. No wheeze.   History of anaphylaxis Discussed with pt today. Pt still does not want to see allergist. But on description of his remote location. No local MD. Limted hour of his school clinic and local hospital closed, I did decide to rx epi-pen. Explained clinical situation when this would be indicated.   Depression Pt mood is still stable he states doing fine.   Migraine variant No recent migraines. Only faint mild HA last night that went away in one hour after advil. No associated neurologic signs or symptoms.   Allergic rhinitis Still controlled today.   Attention-deficit hyperactivity disorder, combined type Currently no symptoms and in past he stated various medications made him feel like a zombie.   Hyperlipidemia Diet and exercise. Repeat lipid panel in one year.   Obesity Weight loss will be beneficial for overall long term health.    Acne Rx doxycycline. If symptoms reoccur then consider dermatologist. Either local [place]     Follow up in 3-4 months or as needed

## 2014-07-30 NOTE — Assessment & Plan Note (Signed)
Diet and exercise. Repeat lipid panel in one year.

## 2014-07-30 NOTE — Assessment & Plan Note (Signed)
Weight loss will be beneficial for overall long term health.

## 2014-07-30 NOTE — Assessment & Plan Note (Signed)
Rx doxycycline. If symptoms reoccur then consider dermatologist. Either local or close to his school.([place]

## 2014-07-30 NOTE — Assessment & Plan Note (Signed)
Pt still breathing well. No wheeze.

## 2014-07-30 NOTE — Assessment & Plan Note (Signed)
Pt mood is still stable he states doing fine.

## 2014-07-30 NOTE — Assessment & Plan Note (Signed)
No recent migraines. Only faint mild HA last night that went away in one hour after advil. No associated neurologic signs or symptoms.

## 2014-12-01 ENCOUNTER — Ambulatory Visit: Payer: Self-pay | Admitting: Medical

## 2017-05-11 ENCOUNTER — Encounter (HOSPITAL_BASED_OUTPATIENT_CLINIC_OR_DEPARTMENT_OTHER): Payer: Self-pay

## 2017-05-11 ENCOUNTER — Other Ambulatory Visit: Payer: Self-pay

## 2017-05-11 DIAGNOSIS — Z91040 Latex allergy status: Secondary | ICD-10-CM | POA: Diagnosis not present

## 2017-05-11 DIAGNOSIS — R42 Dizziness and giddiness: Secondary | ICD-10-CM | POA: Insufficient documentation

## 2017-05-11 DIAGNOSIS — J45909 Unspecified asthma, uncomplicated: Secondary | ICD-10-CM | POA: Diagnosis not present

## 2017-05-11 DIAGNOSIS — F17290 Nicotine dependence, other tobacco product, uncomplicated: Secondary | ICD-10-CM | POA: Diagnosis not present

## 2017-05-11 LAB — BASIC METABOLIC PANEL
ANION GAP: 11 (ref 5–15)
BUN: 20 mg/dL (ref 6–20)
CO2: 20 mmol/L — ABNORMAL LOW (ref 22–32)
Calcium: 9.6 mg/dL (ref 8.9–10.3)
Chloride: 105 mmol/L (ref 101–111)
Creatinine, Ser: 0.99 mg/dL (ref 0.61–1.24)
Glucose, Bld: 104 mg/dL — ABNORMAL HIGH (ref 65–99)
Potassium: 3.8 mmol/L (ref 3.5–5.1)
SODIUM: 136 mmol/L (ref 135–145)

## 2017-05-11 LAB — CBC
HCT: 48.5 % (ref 39.0–52.0)
Hemoglobin: 17 g/dL (ref 13.0–17.0)
MCH: 30.4 pg (ref 26.0–34.0)
MCHC: 35.1 g/dL (ref 30.0–36.0)
MCV: 86.8 fL (ref 78.0–100.0)
Platelets: 321 10*3/uL (ref 150–400)
RBC: 5.59 MIL/uL (ref 4.22–5.81)
RDW: 13 % (ref 11.5–15.5)
WBC: 10.5 10*3/uL (ref 4.0–10.5)

## 2017-05-11 LAB — CBG MONITORING, ED: Glucose-Capillary: 90 mg/dL (ref 65–99)

## 2017-05-11 NOTE — ED Triage Notes (Signed)
Pt states he had near syncopal episode approx 930pm-pt states he was getting out of truck after work-mother took BP and it was elevated-no hx HTN

## 2017-05-12 ENCOUNTER — Emergency Department (HOSPITAL_BASED_OUTPATIENT_CLINIC_OR_DEPARTMENT_OTHER)
Admission: EM | Admit: 2017-05-12 | Discharge: 2017-05-12 | Disposition: A | Discharge status: Home / Self Care | Payer: No Typology Code available for payment source | Attending: Emergency Medicine | Admitting: Emergency Medicine

## 2017-05-12 DIAGNOSIS — R42 Dizziness and giddiness: Secondary | ICD-10-CM

## 2017-05-12 LAB — URINALYSIS, ROUTINE W REFLEX MICROSCOPIC
BILIRUBIN URINE: NEGATIVE
GLUCOSE, UA: NEGATIVE mg/dL
Hgb urine dipstick: NEGATIVE
Ketones, ur: 15 mg/dL — AB
Leukocytes, UA: NEGATIVE
Nitrite: NEGATIVE
PH: 5.5 (ref 5.0–8.0)
Protein, ur: NEGATIVE mg/dL

## 2017-05-12 NOTE — ED Notes (Signed)
Updated on delay

## 2017-05-12 NOTE — ED Provider Notes (Signed)
[facility] EMERGENCY DEPARTMENT Provider Note   CSN: [id number] Arrival date & time: 05/11/17  2159     History   Chief Complaint Chief Complaint  Patient presents with  . Near Syncope    HPI Leonard Obrien is a 22 y.o. male.  The history is provided by the patient.  He has history of asthma, depression, attention deficit disorder, hyperlipidemia and comes in after having an episode of dizziness.  He had just gotten home from work and, as he got out of his truck, he felt as a the right side of his head was being lifted up and he could feel a sense of pressure in his upper arms.  He felt like he might pass out, but never did pass out.  His mother checked his blood pressure and noted that it was elevated to 177 systolic.  There was no actual headache and no nausea or vomiting and there is no visual change.  Symptoms lasted about 20 minutes before resolving.  He feels he is back to normal.  Of note, he is very busy for the last 3 hours of his shift and is not able to eat or drink during that time.  However, he states that he did have water before starting that busy.,  And drank a bottle of Dr. Reino Kent afterwards.  Past Medical History:  Diagnosis Date  . ADHD (attention deficit hyperactivity disorder)   . Allergy   . Asthma   . Depression   . Headache(784.0)   . Hyperlipidemia 07/30/2014  . Obesity   . Vision abnormalities     Patient Active Problem List   Diagnosis Date Noted  . Hyperlipidemia 07/30/2014  . Obesity 07/30/2014  . Acne 07/30/2014  . Wellness examination 07/08/2014  . Allergic rhinitis 07/08/2014  . Asthma in remission 07/08/2014  . Depression 07/08/2014  . Migraine variant 07/08/2014  . History of anaphylaxis 07/08/2014  . 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 Attention-deficit hyperactivity disorder, combined type 09/08/2011    Past Surgical History:  Procedure Laterality Date  . ADENOIDECTOMY    . TONSILLECTOMY          Home Medications    Prior to Admission medications   Medication Sig Start Date End Date Taking? Authorizing Provider  EPINEPHrine 0.3 mg/0.3 mL IJ SOAJ injection Inject 0.3 mLs (0.3 mg total) into the muscle once. For anaphylactic reaction 07/30/14   Saguier, Ramon Dredge, PA-C    Family History Family History  Problem Relation Age of Onset  . Fibromyalgia Mother   . Arthritis Mother   . Migraines Mother   . Heart disease Father   . Diabetes Father   . Cancer Father   . Migraines Father     Social History Social History   Tobacco Use  . Smoking status: Current Every Day Smoker    Types: E-cigarettes  . Smokeless tobacco: Never Used  Substance Use Topics  . Alcohol use: Yes    Alcohol/week: 0.0 oz    Comment: weekly  . Drug use: No     Allergies   Latex; Onion; and Sulfa antibiotics   Review of Systems Review of Systems  All other systems reviewed and are negative.    Physical Exam Updated Vital Signs BP 128/64 (BP Location: Right Arm)   Pulse 72   Temp 98 F (36.7 C) (Oral)   Resp 16   Ht 6\' 4"  (1.93 m)   Wt (!) 136.7 kg (301 lb 5.9 oz)  SpO2 97%   BMI 36.68 kg/m   Physical Exam  Nursing note and vitals reviewed.  22 year old male, resting comfortably and in no acute distress. Vital signs are normal. Oxygen saturation is 97%, which is normal. Head is normocephalic and atraumatic. PERRLA, EOMI. Oropharynx is clear. Neck is nontender and supple without adenopathy or JVD.  There are no carotid bruits. Back is nontender and there is no CVA tenderness. Lungs are clear without rales, wheezes, or rhonchi. Chest is nontender. Heart has regular rate and rhythm without murmur. Abdomen is soft, flat, nontender without masses or hepatosplenomegaly and peristalsis is normoactive. Extremities have no cyanosis or edema, full range of motion is present. Skin is warm and dry without rash. Neurologic: Mental status is normal, cranial nerves are intact, there  are no motor or sensory deficits.  ED Treatments / Results  Labs (all labs ordered are listed, but only abnormal results are displayed) Labs Reviewed  BASIC METABOLIC PANEL - Abnormal; Notable for the following components:      Result Value   CO2 20 (*)    Glucose, Bld 104 (*)    All other components within normal limits  URINALYSIS, ROUTINE W REFLEX MICROSCOPIC - Abnormal; Notable for the following components:   Specific Gravity, Urine >1.030 (*)    Ketones, ur 15 (*)    All other components within normal limits  CBC  CBG MONITORING, ED    EKG  EKG Interpretation  Date/Time:  Thursday May 11 2017 22:12:43 EDT Ventricular Rate:  93 PR Interval:  136 QRS Duration: 88 QT Interval:  342 QTC Calculation: 425 R Axis:   83 Text Interpretation:  Normal sinus rhythm Normal ECG When compared with ECG of 01/03/2005, No significant change was found Confirmed by Dione BoozeGlick, Janelli Welling ([id number]) on 05/11/2017 10:45:06 PM      Procedures Procedures   Medications Ordered in ED Medications - No data to display   Initial Impression / Assessment and Plan / ED Course  I have reviewed the triage vital signs and the nursing notes.  Pertinent lab results that were available during my care of the patient were reviewed by me and considered in my medical decision making (see chart for details).  Episode of dizziness with rather bizarre description of discomfort on the right side of his head.  Cause is unclear, but laboratory workup does show high urine specific gravity as well as presence of ketones.  I suspect that he is mildly dehydrated, but I am not sure that that is causing his symptoms tonight.  However, he has now been back to normal for over 4 hours and has normal exam.  He is resting comfortably and in no distress whatsoever.  I do not see indication for additional workup at this time.  He is encouraged to make sure he stays properly hydrated-especially while at work.  Return precautions  discussed.  Final Clinical Impressions(s) / ED Diagnoses   Final diagnoses:  Dizziness    ED Discharge Orders    None       Dione BoozeGlick, Anner Baity, MD 05/12/17 [phone]

## 2017-05-12 NOTE — Discharge Instructions (Signed)
Make sure you are drinking enough fluids.   Return to the Emergency Department if you start feeling worse.

## 2019-07-13 ENCOUNTER — Other Ambulatory Visit: Payer: Self-pay

## 2019-07-13 ENCOUNTER — Ambulatory Visit: Payer: No Typology Code available for payment source | Attending: Internal Medicine

## 2019-07-13 DIAGNOSIS — Z23 Encounter for immunization: Secondary | ICD-10-CM

## 2019-07-13 NOTE — Progress Notes (Signed)
   Covid-19 Vaccination Clinic  Name:  Leonard Obrien    MRN: [id number] DOB: August 25, 1995  07/13/2019  Mr. [name] was observed post Covid-19 immunization for 30 minutes based on pre-vaccination screening without incident. He was provided with Vaccine Information Sheet and instruction to access the V-Safe system.   Mr. [name] was instructed to call 911 with any severe reactions post vaccine: Marland Kitchen Difficulty breathing  . Swelling of face and throat  . A fast heartbeat  . A bad rash all over body  . Dizziness and weakness   Immunizations Administered    Name Date Dose VIS Date Route   Pfizer COVID-19 Vaccine 07/13/2019  8:33 AM 0.3 mL 04/24/2018 Intramuscular   Manufacturer: [employer], [employer]   Lot: [id number]   NDC: [address]-7

## 2019-08-05 ENCOUNTER — Ambulatory Visit: Payer: No Typology Code available for payment source | Attending: Internal Medicine

## 2019-08-05 DIAGNOSIS — Z23 Encounter for immunization: Secondary | ICD-10-CM

## 2019-08-05 NOTE — Progress Notes (Signed)
   Covid-19 Vaccination Clinic  Name:  Leonard Obrien    MRN: [id number] DOB: 1995/03/16  08/05/2019  Leonard Obrien was observed post Covid-19 immunization for 15 minutes without incident. He was provided with Vaccine Information Sheet and instruction to access the V-Safe system.   Leonard Obrien was instructed to call 911 with any severe reactions post vaccine: Marland Kitchen Difficulty breathing  . Swelling of face and throat  . A fast heartbeat  . A bad rash all over body  . Dizziness and weakness   Immunizations Administered    Name Date Dose VIS Date Route   Pfizer COVID-19 Vaccine 08/05/2019  8:13 AM 0.3 mL 04/24/2018 Intramuscular   Manufacturer: [employer], [employer]   Lot: [id number]   NDC: [address]-9

## 2020-08-18 ENCOUNTER — Telehealth: Payer: Self-pay | Admitting: Medical

## 2020-08-19 NOTE — Telephone Encounter (Signed)
Opened to review 

## 2020-08-29 ENCOUNTER — Encounter (HOSPITAL_COMMUNITY)
Admission: EM | Disposition: A | Discharge status: Home / Self Care | Payer: Self-pay | Source: Home / Self Care | Attending: Emergency Medicine

## 2020-08-29 ENCOUNTER — Emergency Department (HOSPITAL_COMMUNITY): Payer: BC Managed Care – PPO | Admitting: Anesthesiology

## 2020-08-29 ENCOUNTER — Emergency Department (HOSPITAL_BASED_OUTPATIENT_CLINIC_OR_DEPARTMENT_OTHER): Payer: BC Managed Care – PPO

## 2020-08-29 ENCOUNTER — Ambulatory Visit (HOSPITAL_BASED_OUTPATIENT_CLINIC_OR_DEPARTMENT_OTHER)
Admission: EM | Admit: 2020-08-29 | Discharge: 2020-08-29 | Disposition: A | Discharge status: Home / Self Care | Payer: BC Managed Care – PPO | Attending: Emergency Medicine | Admitting: Emergency Medicine

## 2020-08-29 ENCOUNTER — Encounter (HOSPITAL_BASED_OUTPATIENT_CLINIC_OR_DEPARTMENT_OTHER): Payer: Self-pay | Admitting: Emergency Medicine

## 2020-08-29 ENCOUNTER — Other Ambulatory Visit: Payer: Self-pay

## 2020-08-29 DIAGNOSIS — Z91040 Latex allergy status: Secondary | ICD-10-CM | POA: Diagnosis not present

## 2020-08-29 DIAGNOSIS — K3580 Unspecified acute appendicitis: Secondary | ICD-10-CM | POA: Insufficient documentation

## 2020-08-29 DIAGNOSIS — E669 Obesity, unspecified: Secondary | ICD-10-CM | POA: Insufficient documentation

## 2020-08-29 DIAGNOSIS — Z20822 Contact with and (suspected) exposure to covid-19: Secondary | ICD-10-CM | POA: Diagnosis not present

## 2020-08-29 DIAGNOSIS — F17290 Nicotine dependence, other tobacco product, uncomplicated: Secondary | ICD-10-CM | POA: Diagnosis not present

## 2020-08-29 DIAGNOSIS — Z882 Allergy status to sulfonamides status: Secondary | ICD-10-CM | POA: Diagnosis not present

## 2020-08-29 DIAGNOSIS — Z6841 Body Mass Index (BMI) 40.0 and over, adult: Secondary | ICD-10-CM | POA: Diagnosis not present

## 2020-08-29 HISTORY — PX: LAPAROSCOPIC APPENDECTOMY: SHX408

## 2020-08-29 LAB — COMPREHENSIVE METABOLIC PANEL
ALT: 62 U/L — ABNORMAL HIGH (ref 0–44)
AST: 29 U/L (ref 15–41)
Albumin: 5 g/dL (ref 3.5–5.0)
Alkaline Phosphatase: 60 U/L (ref 38–126)
Anion gap: 11 (ref 5–15)
BUN: 18 mg/dL (ref 6–20)
CO2: 21 mmol/L — ABNORMAL LOW (ref 22–32)
Calcium: 9.8 mg/dL (ref 8.9–10.3)
Chloride: 103 mmol/L (ref 98–111)
Creatinine, Ser: 1.1 mg/dL (ref 0.61–1.24)
GFR, Estimated: >60 mL/min (ref >60)
Glucose, Bld: 132 mg/dL — ABNORMAL HIGH (ref 70–99)
Potassium: 4 mmol/L (ref 3.5–5.1)
Sodium: 135 mmol/L (ref 135–145)
Total Bilirubin: 1.2 mg/dL (ref 0.3–1.2)
Total Protein: 8.3 g/dL — ABNORMAL HIGH (ref 6.5–8.1)

## 2020-08-29 LAB — CBC
HCT: 48.7 % (ref 39.0–52.0)
Hemoglobin: 17.3 g/dL — ABNORMAL HIGH (ref 13.0–17.0)
MCH: 30.9 pg (ref 26.0–34.0)
MCHC: 35.5 g/dL (ref 30.0–36.0)
MCV: 87.1 fL (ref 80.0–100.0)
Platelets: 369 10*3/uL (ref 150–400)
RBC: 5.59 MIL/uL (ref 4.22–5.81)
RDW: 12.7 % (ref 11.5–15.5)
WBC: 21.3 10*3/uL — ABNORMAL HIGH (ref 4.0–10.5)
nRBC: 0 % (ref 0.0–0.2)

## 2020-08-29 LAB — RESP PANEL BY RT-PCR (FLU A&B, COVID) ARPGX2
Influenza A by PCR: NEGATIVE
Influenza B by PCR: NEGATIVE
SARS Coronavirus 2 by RT PCR: NEGATIVE

## 2020-08-29 LAB — LIPASE, BLOOD: Lipase: 28 U/L (ref 11–51)

## 2020-08-29 SURGERY — APPENDECTOMY, LAPAROSCOPIC
Anesthesia: General | Site: Abdomen

## 2020-08-29 MED ORDER — DICYCLOMINE HCL 10 MG/ML IM SOLN
20.0000 mg | Freq: Once | INTRAMUSCULAR | Status: AC
  Administered 2020-08-29: 11:00:00 20 mg via INTRAMUSCULAR
  Filled 2020-08-29: qty 2

## 2020-08-29 MED ORDER — AMISULPRIDE (ANTIEMETIC) 5 MG/2ML IV SOLN: 10.0000 mg | Freq: Once | INTRAVENOUS | Status: DC | PRN

## 2020-08-29 MED ORDER — ACETAMINOPHEN 10 MG/ML IV SOLN
INTRAVENOUS | Status: AC
  Administered 2020-08-29: 1000 mg via INTRAVENOUS
  Filled 2020-08-29: qty 100

## 2020-08-29 MED ORDER — BUPIVACAINE-EPINEPHRINE (PF) 0.25% -1:200000 IJ SOLN
INTRAMUSCULAR | Status: DC | PRN
  Administered 2020-08-29: 30 mL

## 2020-08-29 MED ORDER — TRAMADOL HCL 50 MG PO TABS: 50.0000 mg | ORAL_TABLET | Freq: Four times a day (QID) | ORAL | 0 refills | Status: AC | PRN

## 2020-08-29 MED ORDER — SODIUM CHLORIDE 0.9 % IV BOLUS
1000.0000 mL | Freq: Once | INTRAVENOUS | Status: AC
  Administered 2020-08-29: 11:00:00 1000 mL via INTRAVENOUS

## 2020-08-29 MED ORDER — LACTATED RINGERS IV SOLN
INTRAVENOUS | Status: DC | PRN
  Administered 2020-08-29: 1000 mL

## 2020-08-29 MED ORDER — SUCCINYLCHOLINE CHLORIDE 200 MG/10ML IV SOSY
PREFILLED_SYRINGE | INTRAVENOUS | Status: DC | PRN
  Administered 2020-08-29: 140 mg via INTRAVENOUS

## 2020-08-29 MED ORDER — DEXAMETHASONE SODIUM PHOSPHATE 10 MG/ML IJ SOLN
INTRAMUSCULAR | Status: DC | PRN
  Administered 2020-08-29: 10 mg via INTRAVENOUS

## 2020-08-29 MED ORDER — FENTANYL CITRATE (PF) 100 MCG/2ML IJ SOLN
INTRAMUSCULAR | Status: AC
  Administered 2020-08-29: 50 ug via INTRAVENOUS
  Filled 2020-08-29: qty 2

## 2020-08-29 MED ORDER — SODIUM CHLORIDE 0.9 % IV SOLN
2.0000 g | Freq: Once | INTRAVENOUS | Status: AC
  Administered 2020-08-29: 12:00:00 2 g via INTRAVENOUS
  Filled 2020-08-29: qty 20

## 2020-08-29 MED ORDER — OXYCODONE HCL 5 MG PO TABS: 5.0000 mg | ORAL_TABLET | Freq: Once | ORAL | Status: DC | PRN

## 2020-08-29 MED ORDER — SODIUM CHLORIDE 0.9 % IV SOLN
INTRAVENOUS | Status: DC | PRN
  Administered 2020-08-29: 13:00:00 500 mL via INTRAVENOUS

## 2020-08-29 MED ORDER — ONDANSETRON HCL 4 MG/2ML IJ SOLN
4.0000 mg | Freq: Once | INTRAMUSCULAR | Status: AC | PRN
  Administered 2020-08-29: 11:00:00 4 mg via INTRAVENOUS
  Filled 2020-08-29: qty 2

## 2020-08-29 MED ORDER — ONDANSETRON HCL 4 MG/2ML IJ SOLN
INTRAMUSCULAR | Status: DC | PRN
  Administered 2020-08-29: 4 mg via INTRAVENOUS

## 2020-08-29 MED ORDER — ROCURONIUM BROMIDE 10 MG/ML (PF) SYRINGE
PREFILLED_SYRINGE | INTRAVENOUS | Status: DC | PRN
  Administered 2020-08-29: 10 mg via INTRAVENOUS
  Administered 2020-08-29: 50 mg via INTRAVENOUS

## 2020-08-29 MED ORDER — SODIUM CHLORIDE 0.9 % IR SOLN
Status: DC | PRN
  Administered 2020-08-29: 1000 mL

## 2020-08-29 MED ORDER — FENTANYL CITRATE (PF) 250 MCG/5ML IJ SOLN
INTRAMUSCULAR | Status: DC | PRN
  Administered 2020-08-29 (×5): 50 ug via INTRAVENOUS

## 2020-08-29 MED ORDER — FENTANYL CITRATE (PF) 100 MCG/2ML IJ SOLN
INTRAMUSCULAR | Status: DC | PRN
  Administered 2020-08-29: 50 ug via INTRAVENOUS

## 2020-08-29 MED ORDER — MORPHINE SULFATE (PF) 4 MG/ML IV SOLN
4.0000 mg | Freq: Once | INTRAVENOUS | Status: DC
  Filled 2020-08-29: qty 1

## 2020-08-29 MED ORDER — LIDOCAINE 2% (20 MG/ML) 5 ML SYRINGE
INTRAMUSCULAR | Status: DC | PRN
  Administered 2020-08-29: 40 mg via INTRAVENOUS
  Administered 2020-08-29: 60 mg via INTRAVENOUS

## 2020-08-29 MED ORDER — FENTANYL CITRATE (PF) 100 MCG/2ML IJ SOLN
25.0000 ug | INTRAMUSCULAR | Status: DC | PRN
  Administered 2020-08-29: 50 ug via INTRAVENOUS

## 2020-08-29 MED ORDER — ACETAMINOPHEN 10 MG/ML IV SOLN: 1000.0000 mg | Freq: Once | INTRAVENOUS | Status: DC | PRN

## 2020-08-29 MED ORDER — LACTATED RINGERS IV SOLN: INTRAVENOUS | Status: DC | PRN

## 2020-08-29 MED ORDER — IOHEXOL 300 MG/ML  SOLN
100.0000 mL | Freq: Once | INTRAMUSCULAR | Status: AC | PRN
  Administered 2020-08-29: 12:00:00 100 mL via INTRAVENOUS

## 2020-08-29 MED ORDER — MIDAZOLAM HCL 2 MG/2ML IJ SOLN
INTRAMUSCULAR | Status: DC | PRN
  Administered 2020-08-29 (×2): 1 mg via INTRAVENOUS

## 2020-08-29 MED ORDER — PROMETHAZINE HCL 25 MG/ML IJ SOLN: 6.2500 mg | INTRAMUSCULAR | Status: DC | PRN

## 2020-08-29 MED ORDER — ACETAMINOPHEN 160 MG/5ML PO SOLN: 325.0000 mg | ORAL | Status: DC | PRN

## 2020-08-29 MED ORDER — PROPOFOL 10 MG/ML IV BOLUS
INTRAVENOUS | Status: DC | PRN
  Administered 2020-08-29: 200 mg via INTRAVENOUS

## 2020-08-29 MED ORDER — METRONIDAZOLE 500 MG/100ML IV SOLN
500.0000 mg | Freq: Once | INTRAVENOUS | Status: AC
  Administered 2020-08-29: 13:00:00 500 mg via INTRAVENOUS
  Filled 2020-08-29: qty 100

## 2020-08-29 MED ORDER — OXYCODONE HCL 5 MG/5ML PO SOLN: 5.0000 mg | Freq: Once | ORAL | Status: DC | PRN

## 2020-08-29 MED ORDER — ACETAMINOPHEN 325 MG PO TABS
325.0000 mg | ORAL_TABLET | ORAL | Status: DC | PRN
Start: 2020-08-29 — End: 2020-08-29

## 2020-08-29 MED ORDER — SUGAMMADEX SODIUM 500 MG/5ML IV SOLN
INTRAVENOUS | Status: DC | PRN
  Administered 2020-08-29: 500 mg via INTRAVENOUS

## 2020-08-29 SURGICAL SUPPLY — 51 items
ADH SKN CLS APL DERMABOND .7 (GAUZE/BANDAGES/DRESSINGS) ×1
APL PRP STRL LF DISP 70% ISPRP (MISCELLANEOUS) ×1
APPLIER CLIP 5 13 M/L LIGAMAX5 (MISCELLANEOUS)
APPLIER CLIP ROT 10 11.4 M/L (STAPLE)
APR CLP MED LRG 11.4X10 (STAPLE)
APR CLP MED LRG 5 ANG JAW (MISCELLANEOUS)
BAG COUNTER SPONGE SURGICOUNT (BAG) ×2 IMPLANT
BAG SPEC RTRVL LRG 6X4 10 (ENDOMECHANICALS) ×1
BAG SPNG CNTER NS LX DISP (BAG) ×1
CABLE HIGH FREQUENCY MONO STRZ (ELECTRODE) IMPLANT
CHLORAPREP W/TINT 26 (MISCELLANEOUS) ×2 IMPLANT
CLIP APPLIE 5 13 M/L LIGAMAX5 (MISCELLANEOUS) IMPLANT
CLIP APPLIE ROT 10 11.4 M/L (STAPLE) IMPLANT
COVER SURGICAL LIGHT HANDLE (MISCELLANEOUS) ×2 IMPLANT
CUTTER FLEX LINEAR 45M (STAPLE) ×2 IMPLANT
DECANTER SPIKE VIAL GLASS SM (MISCELLANEOUS) ×2 IMPLANT
DERMABOND ADVANCED (GAUZE/BANDAGES/DRESSINGS) ×1
DERMABOND ADVANCED .7 DNX12 (GAUZE/BANDAGES/DRESSINGS) ×1 IMPLANT
DRAIN CHANNEL 19F RND (DRAIN) IMPLANT
ELECT PENCIL ROCKER SW 15FT (MISCELLANEOUS) ×2 IMPLANT
ELECT REM PT RETURN 15FT ADLT (MISCELLANEOUS) ×2 IMPLANT
ENDOLOOP SUT PDS II  0 18 (SUTURE)
ENDOLOOP SUT PDS II 0 18 (SUTURE) IMPLANT
EVACUATOR SILICONE 100CC (DRAIN) IMPLANT
GLOVE SURG ENC MOIS LTX SZ7.5 (GLOVE) ×2 IMPLANT
GLOVE SURG UNDER LTX SZ8 (GLOVE) ×2 IMPLANT
GOWN STRL REUS W/TWL XL LVL3 (GOWN DISPOSABLE) ×4 IMPLANT
GRASPER SUT TROCAR 14GX15 (MISCELLANEOUS) ×1 IMPLANT
IRRIG SUCT STRYKERFLOW 2 WTIP (MISCELLANEOUS) ×2
IRRIGATION SUCT STRKRFLW 2 WTP (MISCELLANEOUS) ×1 IMPLANT
KIT BASIN OR (CUSTOM PROCEDURE TRAY) ×2 IMPLANT
KIT TURNOVER KIT A (KITS) ×2 IMPLANT
PAD POSITIONING PINK XL (MISCELLANEOUS) ×2 IMPLANT
POUCH SPECIMEN RETRIEVAL 10MM (ENDOMECHANICALS) ×2 IMPLANT
RELOAD 45 VASCULAR/THIN (ENDOMECHANICALS) IMPLANT
RELOAD STAPLE 45 2.5 WHT GRN (ENDOMECHANICALS) IMPLANT
RELOAD STAPLE 45 3.5 BLU ETS (ENDOMECHANICALS) IMPLANT
RELOAD STAPLE TA45 3.5 REG BLU (ENDOMECHANICALS) ×2 IMPLANT
SCISSORS LAP 5X35 DISP (ENDOMECHANICALS) IMPLANT
SET TUBE SMOKE EVAC HIGH FLOW (TUBING) ×2 IMPLANT
SHEARS HARMONIC ACE PLUS 36CM (ENDOMECHANICALS) ×2 IMPLANT
SLEEVE ADV FIXATION 5X100MM (TROCAR) ×2 IMPLANT
SUT ETHILON 3 0 PS 1 (SUTURE) IMPLANT
SUT MNCRL AB 4-0 PS2 18 (SUTURE) ×2 IMPLANT
TOWEL OR 17X26 10 PK STRL BLUE (TOWEL DISPOSABLE) IMPLANT
TOWEL OR NON WOVEN STRL DISP B (DISPOSABLE) ×2 IMPLANT
TRAY FOLEY MTR SLVR 14FR STAT (SET/KITS/TRAYS/PACK) ×2 IMPLANT
TRAY FOLEY MTR SLVR 16FR STAT (SET/KITS/TRAYS/PACK) ×2 IMPLANT
TRAY LAPAROSCOPIC (CUSTOM PROCEDURE TRAY) ×2 IMPLANT
TROCAR ADV FIXATION 5X100MM (TROCAR) ×2 IMPLANT
TROCAR XCEL BLUNT TIP 100MML (ENDOMECHANICALS) ×2 IMPLANT

## 2020-08-29 NOTE — ED Notes (Signed)
Checked on PT for urine, he stated "still waiting". Let him know to use the call bell when he is done.

## 2020-08-29 NOTE — ED Triage Notes (Signed)
Pt arrives pov with driver, c/o lower abdominal pain with N/V/D that started yesterday. Pain worsens with ambulation. Pt denies fever, endorses urinary retention.

## 2020-08-29 NOTE — Anesthesia Procedure Notes (Signed)
Date/Time: 08/29/2020 3:55 PM Performed by: Minerva Ends, CRNA Oxygen Delivery Method: Simple face mask Placement Confirmation: positive ETCO2 and breath sounds checked- equal and bilateral Dental Injury: Teeth and Oropharynx as per pre-operative assessment

## 2020-08-29 NOTE — ED Provider Notes (Addendum)
[facility] EMERGENCY DEPARTMENT Provider Note   CSN: [id number] Arrival date & time: 08/29/20  1016     History Chief Complaint  Patient presents with   Abdominal Pain    Leonard Obrien is a 25 y.o. male.  The history is provided by the patient. No language interpreter was used.  Abdominal Pain Pain location:  Generalized Pain quality: throbbing   Pain radiates to:  Does not radiate Pain severity:  Moderate Onset quality:  Gradual Duration:  1 day Timing:  Constant Progression:  Unchanged Chronicity:  New Relieved by:  Nothing Worsened by:  Vomiting Ineffective treatments:  OTC medications Associated symptoms: diarrhea and vomiting   Associated symptoms: no chest pain, no chills, no constipation, no cough, no dysuria, no fever, no hematuria, no nausea, no shortness of breath and no sore throat   Diarrhea:    Duration:  4 months   Timing:  Constant     Past Medical History:  Diagnosis Date   ADHD (attention deficit hyperactivity disorder)    Allergy    Asthma    Depression    Headache(784.0)    Hyperlipidemia 07/30/2014   Obesity    Vision abnormalities     Patient Active Problem List   Diagnosis Date Noted   Hyperlipidemia 07/30/2014   Obesity 07/30/2014   Acne 07/30/2014   Wellness examination 07/08/2014   Allergic rhinitis 07/08/2014   Asthma in remission 07/08/2014   Depression 07/08/2014   Migraine variant 07/08/2014   History of anaphylaxis 07/08/2014   Moderate major depression, 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   Attention-deficit hyperactivity disorder, combined type 09/08/2011    Past Surgical History:  Procedure Laterality Date   ADENOIDECTOMY     TONSILLECTOMY         Family History  Problem Relation Age of Onset   Fibromyalgia Mother    Arthritis Mother    Migraines Mother    Heart disease Father    Diabetes Father    Cancer Father    Migraines Father     Social History   Tobacco Use   Smoking status: Every Day     Pack years: 0.00    Types: E-cigarettes   Smokeless tobacco: Never  Substance Use Topics   Alcohol use: Yes    Alcohol/week: 0.0 standard drinks    Comment: weekly   Drug use: No    Home Medications Prior to Admission medications   Medication Sig Start Date End Date Taking? Authorizing Provider  EPINEPHrine 0.3 mg/0.3 mL IJ SOAJ injection Inject 0.3 mLs (0.3 mg total) into the muscle once. For anaphylactic reaction 07/30/14   Saguier, Ramon Dredge, PA-C    Allergies    Latex, Onion, and Sulfa antibiotics  Review of Systems   Review of Systems  Constitutional:  Negative for appetite change, chills and fever.  HENT:  Negative for ear pain, rhinorrhea, sneezing and sore throat.   Eyes:  Negative for photophobia and visual disturbance.  Respiratory:  Negative for cough, chest tightness, shortness of breath and wheezing.   Cardiovascular:  Negative for chest pain and palpitations.  Gastrointestinal:  Positive for abdominal pain, diarrhea and vomiting. Negative for blood in stool, constipation and nausea.  Genitourinary:  Negative for dysuria, hematuria and urgency.  Musculoskeletal:  Negative for myalgias.  Skin:  Negative for rash.  Neurological:  Negative for dizziness, weakness and light-headedness.   Physical Exam Updated Vital Signs BP 123/72 (BP Location: Right Arm)   Pulse 67   Temp 98.4 F (36.9  C) (Oral)   Resp 18   Ht 6\' 4"  (1.93 m)   Wt (!) 160.6 kg   SpO2 100%   BMI 43.09 kg/m   Physical Exam Vitals and nursing note reviewed.  Constitutional:      General: He is not in acute distress.    Appearance: He is well-developed. He is obese.  HENT:     Head: Normocephalic and atraumatic.     Nose: Nose normal.  Eyes:     General: No scleral icterus.       Left eye: No discharge.     Conjunctiva/sclera: Conjunctivae normal.  Cardiovascular:     Rate and Rhythm: Normal rate and regular rhythm.     Heart sounds: Normal heart sounds. No murmur heard.   No friction  rub. No gallop.  Pulmonary:     Effort: Pulmonary effort is normal. No respiratory distress.     Breath sounds: Normal breath sounds.  Abdominal:     General: Bowel sounds are normal. There is no distension.     Palpations: Abdomen is soft.     Tenderness: There is abdominal tenderness in the right upper quadrant, right lower quadrant, epigastric area, suprapubic area, left upper quadrant and left lower quadrant. There is no guarding.  Musculoskeletal:        General: Normal range of motion.     Cervical back: Normal range of motion and neck supple.  Skin:    General: Skin is warm and dry.     Findings: No rash.  Neurological:     Mental Status: He is alert.     Motor: No abnormal muscle tone.     Coordination: Coordination normal.    ED Results / Procedures / Treatments   Labs (all labs ordered are listed, but only abnormal results are displayed) Labs Reviewed  COMPREHENSIVE METABOLIC PANEL - Abnormal; Notable for the following components:      Result Value   CO2 21 (*)    Glucose, Bld 132 (*)    Total Protein 8.3 (*)    ALT 62 (*)    All other components within normal limits  CBC - Abnormal; Notable for the following components:   WBC 21.3 (*)    Hemoglobin 17.3 (*)    All other components within normal limits  RESP PANEL BY RT-PCR (FLU A&B, COVID) ARPGX2  LIPASE, BLOOD  URINALYSIS, ROUTINE W REFLEX MICROSCOPIC    EKG None  Radiology CT ABDOMEN PELVIS W CONTRAST  Result Date: 08/29/2020 CLINICAL DATA:  Acute left lower quadrant abdominal pain. EXAM: CT ABDOMEN AND PELVIS WITH CONTRAST TECHNIQUE: Multidetector CT imaging of the abdomen and pelvis was performed using the standard protocol following bolus administration of intravenous contrast. CONTRAST:  10/30/2020 OMNIPAQUE IOHEXOL 300 MG/ML  SOLN COMPARISON:  None. FINDINGS: Lower chest: No acute abnormality. Hepatobiliary: No gallstones or biliary dilatation is noted. Hepatic steatosis. Pancreas: Unremarkable. No pancreatic  ductal dilatation or surrounding inflammatory changes. Spleen: Normal in size without focal abnormality. Adrenals/Urinary Tract: Adrenal glands are unremarkable. Kidneys are normal, without renal calculi, focal lesion, or hydronephrosis. Bladder is unremarkable. Stomach/Bowel: The stomach appears normal. There is no evidence of bowel obstruction or inflammation. The appendix is enlarged and inflamed consistent with acute appendicitis. Appendix: Location: Right lower quadrant. Diameter: 11 mm. Appendicolith: No. Mucosal hyper-enhancement: No. Extraluminal gas: No. Periappendiceal collection: No. Vascular/Lymphatic: No significant vascular findings are present. No enlarged abdominal or pelvic lymph nodes. Reproductive: Prostate is unremarkable. Other: No abdominal wall hernia or abnormality. No  abdominopelvic ascites. Musculoskeletal: No acute or significant osseous findings. IMPRESSION: Findings consistent with acute appendicitis. No definite abscess is noted. Hepatic steatosis. Electronically Signed   By: Lupita Raider M.D.   On: 08/29/2020 11:55    Procedures Procedures   Medications Ordered in ED Medications  cefTRIAXone (ROCEPHIN) 2 g in sodium chloride 0.9 % 100 mL IVPB (has no administration in time range)    And  metroNIDAZOLE (FLAGYL) IVPB 500 mg (has no administration in time range)  ondansetron (ZOFRAN) injection 4 mg (4 mg Intravenous Given 08/29/20 1052)  sodium chloride 0.9 % bolus 1,000 mL (1,000 mLs Intravenous New Bag/Given 08/29/20 1106)  dicyclomine (BENTYL) injection 20 mg (20 mg Intramuscular Given 08/29/20 1104)  iohexol (OMNIPAQUE) 300 MG/ML solution 100 mL (100 mLs Intravenous Contrast Given 08/29/20 1138)    ED Course  I have reviewed the triage vital signs and the nursing notes.  Pertinent labs & imaging results that were available during my care of the patient were reviewed by me and considered in my medical decision making (see chart for details).  Clinical Course as of  08/29/20 1215  Sat Aug 29, 2020  1122 WBC(!): 21.3 [HK]  1122 Hemoglobin(!): 17.3 [HK]    Clinical Course User Index [HK] Dietrich Pates, PA-C   MDM Rules/Calculators/A&P                          25 year old male presenting to the ED with a chief complaint of abdominal pain.  Reports symptoms began yesterday.  Has been having diarrhea for the past 4 months for which he is seeing a specialist for change.  However started having several episodes of nonbloody, nonbilious emesis yesterday that progressed today.  Tried taking Pepto-Bismol but had vomiting immediately afterwards.  Pain is in the lower abdomen and radiates up and around to the top of the abdomen.  He denies any sick contacts with similar symptoms or suspicious food intake.  No urinary symptoms, fever, chest pain, shortness of breath or cough.  No alcohol, tobacco or drug use.  On exam there is generalized tenderness of the abdomen without rebound or guarding.  He is afebrile here.  Will order lab work and symptomatic treatment and reassess.  Lab work significant for leukocytosis of 21.3.  He does appear slightly dehydrated based on his lab work.  CT of abdomen pelvis shows findings consistent with acute appendicitis without complicating features.  Patient has not had anything to eat today.  Will swab for COVID and begin antibiotics.  We will transfer and consult general surgery.  Spoke to Dr. Cliffton Asters, on-call general surgeon.  Request patient be transferred ED to ED to [facility].  I spoke to Dr. Rhunette Croft, ED attending at [facility] who accepts the patient in transfer.  12:15 PM Callback from Dr. Cliffton Asters, general surgeon.  He is requesting that patient actually be transferred to preop area of [facility] rather than ED to ED.  I informed Diplomatic Services operational officer of this.  Dr. Cliffton Asters will be the accepting physician  Portions of this note were generated with Dragon dictation software. Dictation errors may occur despite best attempts at proofreading.  Final  Clinical Impression(s) / ED Diagnoses Final diagnoses:  Acute appendicitis, unspecified acute appendicitis type    Rx / DC Orders ED Discharge Orders     None            Dietrich Pates, PA-C 08/29/20 1215    Gwyneth Sprout, MD 08/29/20 1406

## 2020-08-29 NOTE — Op Note (Signed)
Leonard Obrien [id number]   PRE-OPERATIVE DIAGNOSIS:  Acute appendicitis  POST-OPERATIVE DIAGNOSIS:  Acute suppurative appendicitis without perforation or gangrene  PROCEDURE: Laparoscopic appendectomy  SURGEON:  Stephanie Coup. Cliffton Asters, M.D.  ANESTHESIA: General endotracheal  EBL:   5 mL  DRAINS: None  SPECIMEN:  Appendix  COUNTS:  Sponge, needle and instrument counts were reported correct x2 at conclusion of the operation  DISPOSITION:  PACU in satisfactory condition  COMPLICATIONS: None  FINDINGS: Acutely inflamed appendicitis with suppuration but no evident perforation or gangrene. Appendectomy carried out uneventfully.  DESCRIPTION:   The patient was identified & brought into the operating room. SCDs were in place and functioning. General endotracheal anesthesia was administered. Preoperative antibiotics were administered. The patient was positioned supine with left arm tucked. Hair on the abdomen was then clipped by the OR team. A foley catheter was inserted under sterile conditions. The abdomen was prepped and draped in the standard sterile fashion. A surgical timeout confirmed our plan.  A small incision was made in the supraumbilical fold. The subcutaneous tissue was dissected and the umbilical stalk identified. The stalk was grasped with a Kocher and retracted outwardly. The supraumbilical fascia was exposed and incised. Peritoneal entry was carefully made bluntly. A 0 Vicryl purse-string suture was placed and then the [facility] port was introduced into the abdomen.  CO2 insufflation commenced to . The laparoscope was inserted and confirmed no evidence of trocar site complications. The patient was then positioned in Trendelenburg. Two additional ports were placed - one in left lower quadrant and another in the suprapubic midline taking care to stay well above the bladder - 3 fingerbreadths above the pubic symphysis. The bed was then slightly tilted to place the left side  down.  The appendix was identified and attachments to the appendix to the surrounding tissues were freed without difficulty. There is suppuration around the appendix but no evident abscess, gangrene or perforation. It is clearly inflamed in appearance  The appendix was elevated.  The base of the appendix was circumferentially dissected taking care to preserve the cecum free of injury. The base was noted to be viable and healthy appearing. The terminal ileum, cecum and ascending colon also appeared normal. The base of the appendix was then stapled with a blue load, flush with the cecum, taking care to stay clear of the ileocecal valve. The mesoappendix was then ligated by "hugging" the appendix using the harmonic scalpel. The mesoappendix was inspected and noted to be hemostatic. The appendix was placed in an EndoBag.  The right lower quadrant was conservatively irrigated. Hemostasis was noted to be achieved - taking time to inspect the ligated mesoappendix, colon mesentery, and retroperitoneum. Staple line was noted to be intact on the cecum with no bleeding. There was no perforation or injury. The right lower quadrant appeared clean and as such, no drain was placed.  The EndoBag was then removed through the umbilical port site and passed off as specimen.   CO2 was exhausted.  The umbilical fascia was closed by tying the 0 Vicryl pursestring suture.  Pneumoperitoneum was reestablished.  An additional 0 Vicryl stitch was then placed at the umbilical closure site for reinforcement using the laparoscopic suture passer.  The fascia is palpated noted to be completely closed.  The left lower quadrant port was removed under direct visualization and hemostatic. The CO2 was exhausted from the abdomen and the suprapubic port removed. The skin of all port sites was then approximated using 4-0 Monocryl suture. The incisions were  covered with Dermabond.  He was then awakened from general anesthesia, extubated, and  transferred to a stretcher for transport to PACU in satisfactory condition.

## 2020-08-29 NOTE — ED Notes (Signed)
Patient reports lower abdominal pain radiating up right side, vomiting x 5 over last 24 hours, diarrhea (ongoing for 5 months per patient)

## 2020-08-29 NOTE — Anesthesia Procedure Notes (Signed)
Procedure Name: Intubation Date/Time: 08/29/2020 2:35 PM Performed by: Minerva Ends, CRNA Pre-anesthesia Checklist: Patient identified, Emergency Drugs available, Suction available and Patient being monitored Patient Re-evaluated:Patient Re-evaluated prior to induction Oxygen Delivery Method: Circle System Utilized Preoxygenation: Pre-oxygenation with 100% oxygen Induction Type: IV induction and Cricoid Pressure applied Ventilation: Mask ventilation without difficulty Laryngoscope Size: Miller and 2 Grade View: Grade I Tube type: Oral Number of attempts: 1 Airway Equipment and Method: Stylet Placement Confirmation: ETT inserted through vocal cords under direct vision, positive ETCO2 and breath sounds checked- equal and bilateral Secured at: 23 cm Tube secured with: Tape Dental Injury: Teeth and Oropharynx as per pre-operative assessment  Comments: Smooth IV induction- intubation AM CRNA atraumatic - teeth and mouth as preop bilat BS

## 2020-08-29 NOTE — Transfer of Care (Signed)
Immediate Anesthesia Transfer of Care Note  Patient: [name]  Procedure(s) Performed: APPENDECTOMY LAPAROSCOPIC (Abdomen)  Patient Location: PACU  Anesthesia Type:General  Level of Consciousness: sedated  Airway & Oxygen Therapy: Patient Spontanous Breathing and Patient connected to face mask oxygen  Post-op Assessment: Report given to RN and Post -op Vital signs reviewed and stable  Post vital signs: Reviewed and stable  Last Vitals:  Vitals Value Taken Time  BP    Temp    Pulse 87 08/29/20 1605  Resp 23 08/29/20 1605  SpO2 96 % 08/29/20 1605  Vitals shown include unvalidated device data.  Last Pain:  Vitals:   08/29/20 1242  TempSrc: Oral  PainSc:          Complications: No notable events documented.

## 2020-08-29 NOTE — Discharge Instructions (Signed)
POST OP INSTRUCTIONS  DIET: As tolerated. Follow a light bland diet the first 24 hours after arrival home, such as soup, liquids, crackers, etc.  Be sure to include lots of fluids daily.  Avoid fast food or heavy meals as your are more likely to get nauseated.  Eat a low fat the next few days after surgery. Do not drive, operate heavy machinery or plan to make any important decisions for the next 24 hours.  Take your usually prescribed home medications unless otherwise directed.  PAIN CONTROL: Pain is best controlled by a usual combination of three different methods TOGETHER: Ice/Heat Over the counter pain medication Prescription pain medication Most patients will experience some swelling and bruising around the surgical site.  Ice packs or heating pads (30-60 minutes up to 6 times a day) will help. Some people prefer to use ice alone, heat alone, alternating between ice & heat.  Experiment to what works for you.  Swelling and bruising can take several weeks to resolve.   It is helpful to take an over-the-counter pain medication regularly for the first few weeks: Ibuprofen (Motrin/Advil) - 200mg  tabs - take 3 tabs (600mg ) every 6 hours as needed for pain Acetaminophen (Tylenol) - you may take 650mg  every 6 hours as needed. You can take this with motrin as they act differently on the body. If you are taking a narcotic pain medication that has acetaminophen in it, do not take over the counter tylenol at the same time.  Iii. NOTE: You may take both of these medications together - most patients  find it most helpful when alternating between the two (i.e. Ibuprofen at 6am, tylenol at 9am, ibuprofen at 12pm .. ) A  prescription for pain medication should be given to you upon discharge.  Take your pain medication as prescribed if your pain is not adequatly controlled with the over-the-counter pain reliefs mentioned above.  Avoid getting constipated.  Between the surgery and the pain medications, it is  common to experience some constipation.  Increasing fluid intake and taking a fiber supplement (such as Metamucil, Citrucel, FiberCon, MiraLax, etc) 1-2 times a day regularly will usually help prevent this problem from occurring.  A mild laxative (prune juice, Milk of Magnesia, MiraLax, etc) should be taken according to package directions if there are no bowel movements after 48 hours.    Dressing: Your incisions are covered in Dermabond which is like sterile superglue for the skin. This will come off on it's own in a couple weeks. It is waterproof and you may bathe normally starting the day after your surgery in a shower. Avoid baths/pools/lakes/oceans until your wounds have fully healed.  ACTIVITIES as tolerated:   Avoid heavy lifting (>10lbs or 1 gallon of milk) for the next 6 weeks. You may resume regular (light) daily activities beginning the next day--such as daily self-care, walking, climbing stairs--gradually increasing activities as tolerated.  If you can walk 30 minutes without difficulty, it is safe to try more intense activity such as jogging, treadmill, bicycling, low-impact aerobics.  DO NOT PUSH THROUGH PAIN.  Let pain be your guide: If it hurts to do something, don't do it. You may drive when you are no longer taking prescription pain medication, you can comfortably wear a seatbelt, and you can safely maneuver your car and apply brakes.   FOLLOW UP in our office Please call [facility] at [phone] to set up an appointment to see your surgeon in the office for a follow-up appointment approximately 2 weeks  after your surgery. Make sure that you call for this appointment the day you arrive home to insure a convenient appointment time.  9. If you have disability or family leave forms that need to be completed, you may have them completed by your primary care physician's office; for return to work instructions, please ask our office staff and they will be happy to assist you in obtaining this  documentation   When to call us [phone]: Poor pain control Reactions / problems with new medications (rash/itching, etc)  Fever over 101.5 F (38.5 C) Inability to urinate Nausea/vomiting Worsening swelling or bruising Continued bleeding from incision. Increased pain, redness, or drainage from the incision  The clinic staff is available to answer your questions during regular business hours (8:30am-5pm).  Please don't hesitate to call and ask to speak to one of our nurses for clinical concerns.   A surgeon from [facility] is always on call at the hospitals   If you have a medical emergency, go to the nearest emergency room or call 911.  [facility] [address], [address], [place], [place]  [address] MAIN: [phone] FAX: [phone] [email or web address]

## 2020-08-29 NOTE — Anesthesia Preprocedure Evaluation (Addendum)
Anesthesia Evaluation  Patient identified by MRN, date of birth, ID band Patient awake    Reviewed: Allergy & Precautions, NPO status , Patient's Chart, lab work & pertinent test results  Airway Mallampati: II  TM Distance: >3 FB Neck ROM: Full    Dental  (+) Teeth Intact, Dental Advisory Given   Pulmonary asthma , Current Smoker,    breath sounds clear to auscultation       Cardiovascular  Rhythm:Regular Rate:Normal     Neuro/Psych  Headaches, PSYCHIATRIC DISORDERS Depression    GI/Hepatic negative GI ROS, Neg liver ROS,   Endo/Other    Renal/GU negative Renal ROS     Musculoskeletal negative musculoskeletal ROS (+)   Abdominal (+) + obese,   Peds  Hematology negative hematology ROS (+)   Anesthesia Other Findings   Reproductive/Obstetrics                            Anesthesia Physical Anesthesia Plan  ASA: 2 and emergent  Anesthesia Plan: General   Post-op Pain Management:    Induction: Intravenous  PONV Risk Score and Plan: 2 and Ondansetron, Dexamethasone and Midazolam  Airway Management Planned: Oral ETT  Additional Equipment: None  Intra-op Plan:   Post-operative Plan: Extubation in OR  Informed Consent: I have reviewed the patients History and Physical, chart, labs and discussed the procedure including the risks, benefits and alternatives for the proposed anesthesia with the patient or authorized representative who has indicated his/her understanding and acceptance.     Dental advisory given  Plan Discussed with: CRNA  Anesthesia Plan Comments:        Anesthesia Quick Evaluation

## 2020-08-29 NOTE — H&P (Addendum)
CC: RLQ pain, acute appendicitis  Requesting provider: Dietrich Pates  HPI: Leonard Obrien is an 25 y.o. male hx obesity, HLD, with 1d hx of worsening RLQ pain. Some diarrhea for the last few months of unclear etiology but no reported blood in stool; no abdominal pain prior to yesterday either. He began having n/v yesterday, chills; no fever as far as he can tell. Never had this before. No weight changes.  Shx: Vapes daily; denies EtOH/drug use; works as an over the Designer, jewellery  Past Medical History:  Diagnosis Date   ADHD (attention deficit hyperactivity disorder)    Allergy    Asthma    Depression    Headache(784.0)    Hyperlipidemia 07/30/2014   Obesity    Vision abnormalities     Past Surgical History:  Procedure Laterality Date   ADENOIDECTOMY     TONSILLECTOMY      Family History  Problem Relation Age of Onset   Fibromyalgia Mother    Arthritis Mother    Migraines Mother    Heart disease Father    Diabetes Father    Cancer Father    Migraines Father     Social:  reports that he has been smoking e-cigarettes. He has never used smokeless tobacco. He reports current alcohol use. He reports that he does not use drugs.  Allergies:  Allergies  Allergen Reactions   Latex Itching    rash   Onion    Sulfa Antibiotics Rash    Medications: I have reviewed the patient's current medications.  Results for orders placed or performed during the hospital encounter of 08/29/20 (from the past 48 hour(s))  Lipase, blood     Status: None   Collection Time: 08/29/20 10:49 AM  Result Value Ref Range   Lipase 28 11 - 51 U/L    Comment: Performed at [facility], [address] [place], [place] [address]  Comprehensive metabolic panel     Status: Abnormal   Collection Time: 08/29/20 10:49 AM  Result Value Ref Range   Sodium 135 135 - 145 mmol/L   Potassium 4.0 3.5 - 5.1 mmol/L   Chloride 103 98 - 111 mmol/L   CO2 21 (L) 22 - 32 mmol/L   Glucose, Bld 132  (H) 70 - 99 mg/dL    Comment: Glucose reference range applies only to samples taken after fasting for at least 8 hours.   BUN 18 6 - 20 mg/dL   Creatinine, Ser 0.53 0.61 - 1.24 mg/dL   Calcium 9.8 8.9 - 97.6 mg/dL   Total Protein 8.3 (H) 6.5 - 8.1 g/dL   Albumin 5.0 3.5 - 5.0 g/dL   AST 29 15 - 41 U/L   ALT 62 (H) 0 - 44 U/L   Alkaline Phosphatase 60 38 - 126 U/L   Total Bilirubin 1.2 0.3 - 1.2 mg/dL   GFR, Estimated >73 >41 mL/min    Comment: (NOTE) Calculated using the CKD-EPI Creatinine Equation (2021)    Anion gap 11 5 - 15    Comment: Performed at [facility], [address] [place], [place] [address]  CBC     Status: Abnormal   Collection Time: 08/29/20 10:49 AM  Result Value Ref Range   WBC 21.3 (H) 4.0 - 10.5 K/uL   RBC 5.59 4.22 - 5.81 MIL/uL   Hemoglobin 17.3 (H) 13.0 - 17.0 g/dL   HCT 24.0 97.3 - 53.2 %   MCV 87.1 80.0 -  100.0 fL   MCH 30.9 26.0 - 34.0 pg   MCHC 35.5 30.0 - 36.0 g/dL   RDW 24.0 97.3 - 53.2 %   Platelets 369 150 - 400 K/uL   nRBC 0.0 0.0 - 0.2 %    Comment: Performed at [facility], [address] [place], [place] [address]    CT ABDOMEN PELVIS W CONTRAST  Result Date: 08/29/2020 CLINICAL DATA:  Acute left lower quadrant abdominal pain. EXAM: CT ABDOMEN AND PELVIS WITH CONTRAST TECHNIQUE: Multidetector CT imaging of the abdomen and pelvis was performed using the standard protocol following bolus administration of intravenous contrast. CONTRAST:  OMNIPAQUE IOHEXOL 300 MG/ML  SOLN COMPARISON:  None. FINDINGS: Lower chest: No acute abnormality. Hepatobiliary: No gallstones or biliary dilatation is noted. Hepatic steatosis. Pancreas: Unremarkable. No pancreatic ductal dilatation or surrounding inflammatory changes. Spleen: Normal in size without focal abnormality. Adrenals/Urinary Tract: Adrenal glands are unremarkable. Kidneys are normal, without renal calculi, focal lesion, or hydronephrosis. Bladder is unremarkable.  Stomach/Bowel: The stomach appears normal. There is no evidence of bowel obstruction or inflammation. The appendix is enlarged and inflamed consistent with acute appendicitis. Appendix: Location: Right lower quadrant. Diameter: 11 mm. Appendicolith: No. Mucosal hyper-enhancement: No. Extraluminal gas: No. Periappendiceal collection: No. Vascular/Lymphatic: No significant vascular findings are present. No enlarged abdominal or pelvic lymph nodes. Reproductive: Prostate is unremarkable. Other: No abdominal wall hernia or abnormality. No abdominopelvic ascites. Musculoskeletal: No acute or significant osseous findings. IMPRESSION: Findings consistent with acute appendicitis. No definite abscess is noted. Hepatic steatosis. Electronically Signed   By: Lupita Raider M.D.   On: 08/29/2020 11:55    ROS - all of the below systems have been reviewed with the patient and positives are indicated with bold text General: chills, fever or night sweats Eyes: blurry vision or double vision ENT: epistaxis or sore throat Allergy/Immunology: itchy/watery eyes or nasal congestion Hematologic/Lymphatic: bleeding problems, blood clots or swollen lymph nodes Endocrine: temperature intolerance or unexpected weight changes Breast: new or changing breast lumps or nipple discharge Resp: cough, shortness of breath, or wheezing CV: chest pain or dyspnea on exertion GI: as per HPI GU: dysuria, trouble voiding, or hematuria MSK: joint pain or joint stiffness Neuro: TIA or stroke symptoms Derm: pruritus and skin lesion changes Psych: anxiety and depression  PE Blood pressure 123/72, pulse 67, temperature 98.4 F (36.9 C), temperature source Oral, resp. rate 18, height 6\' 4"  (1.93 m), weight (!) 160.6 kg, SpO2 100 %. Constitutional: NAD; conversant; no deformities Eyes: Moist conjunctiva; no lid lag; anicteric; PERRL Neck: Trachea midline; no thyromegaly Lungs: Normal respiratory effort; no tactile fremitus CV: RRR; no  palpable thrills; no pitting edema GI: Abdomen is obese, soft, nondistended; mildly ttp in RLQ; no palpable hepatosplenomegaly. MSK: Normal range of motion of extremities; no clubbing/cyanosis Psychiatric: Appropriate affect; alert and oriented x3 Lymphatic: No palpable cervical or axillary lymphadenopathy  Results for orders placed or performed during the hospital encounter of 08/29/20 (from the past 48 hour(s))  Lipase, blood     Status: None   Collection Time: 08/29/20 10:49 AM  Result Value Ref Range   Lipase 28 11 - 51 U/L    Comment: Performed at [facility], [address] [place], [place]  Comprehensive metabolic panel     Status: Abnormal   Collection Time: 08/29/20 10:49 AM  Result Value Ref Range   Sodium 135 135 - 145 mmol/L   Potassium 4.0 3.5 - 5.1 mmol/L  Chloride 103 98 - 111 mmol/L   CO2 21 (L) 22 - 32 mmol/L   Glucose, Bld 132 (H) 70 - 99 mg/dL    Comment: Glucose reference range applies only to samples taken after fasting for at least 8 hours.   BUN 18 6 - 20 mg/dL   Creatinine, Ser 4.091.10 0.61 - 1.24 mg/dL   Calcium 9.8 8.9 - 81.110.3 mg/dL   Total Protein 8.3 (H) 6.5 - 8.1 g/dL   Albumin 5.0 3.5 - 5.0 g/dL   AST 29 15 - 41 U/L   ALT 62 (H) 0 - 44 U/L   Alkaline Phosphatase 60 38 - 126 U/L   Total Bilirubin 1.2 0.3 - 1.2 mg/dL   GFR, Estimated >91>60 >47>60 mL/min    Comment: (NOTE) Calculated using the CKD-EPI Creatinine Equation (2021)    Anion gap 11 5 - 15    Comment: Performed at [facility] [place], [address] [place], [place] [address]  CBC     Status: Abnormal   Collection Time: 08/29/20 10:49 AM  Result Value Ref Range   WBC 21.3 (H) 4.0 - 10.5 K/uL   RBC 5.59 4.22 - 5.81 MIL/uL   Hemoglobin 17.3 (H) 13.0 - 17.0 g/dL   HCT 21.348.7 08.639.0 - 57.852.0 %   MCV 87.1 80.0 - 100.0 fL   MCH 30.9 26.0 - 34.0 pg   MCHC 35.5 30.0 - 36.0 g/dL   RDW 46.912.7 62.911.5 - 52.815.5 %   Platelets 369 150 - 400 K/uL   nRBC 0.0 0.0 - 0.2 %    Comment:  Performed at [facility], [address] [place], [place] [address]    CT ABDOMEN PELVIS W CONTRAST  Result Date: 08/29/2020 CLINICAL DATA:  Acute left lower quadrant abdominal pain. EXAM: CT ABDOMEN AND PELVIS WITH CONTRAST TECHNIQUE: Multidetector CT imaging of the abdomen and pelvis was performed using the standard protocol following bolus administration of intravenous contrast. CONTRAST:  100mL OMNIPAQUE IOHEXOL 300 MG/ML  SOLN COMPARISON:  None. FINDINGS: Lower chest: No acute abnormality. Hepatobiliary: No gallstones or biliary dilatation is noted. Hepatic steatosis. Pancreas: Unremarkable. No pancreatic ductal dilatation or surrounding inflammatory changes. Spleen: Normal in size without focal abnormality. Adrenals/Urinary Tract: Adrenal glands are unremarkable. Kidneys are normal, without renal calculi, focal lesion, or hydronephrosis. Bladder is unremarkable. Stomach/Bowel: The stomach appears normal. There is no evidence of bowel obstruction or inflammation. The appendix is enlarged and inflamed consistent with acute appendicitis. Appendix: Location: Right lower quadrant. Diameter: 11 mm. Appendicolith: No. Mucosal hyper-enhancement: No. Extraluminal gas: No. Periappendiceal collection: No. Vascular/Lymphatic: No significant vascular findings are present. No enlarged abdominal or pelvic lymph nodes. Reproductive: Prostate is unremarkable. Other: No abdominal wall hernia or abnormality. No abdominopelvic ascites. Musculoskeletal: No acute or significant osseous findings. IMPRESSION: Findings consistent with acute appendicitis. No definite abscess is noted. Hepatic steatosis. Electronically Signed   By: Lupita RaiderJames  Green Jr M.D.   On: 08/29/2020 11:55     A/P: Leonard Obrien is an 25 y.o. male with acute appendicitis without evidence of perforation or abscess  -The anatomy and physiology of the GI tract was discussed with the patient. The pathophysiology of appendicitis was reviewed as  well. -We have discussed options moving forward for treatment, covering IV abx vs surgery. We discussed that with antibiotics alone, there is reasonable success in managing appendicitis, however, risks of recurrence at [age]yrs being as high as 40% in some studies. We discussed appendectomy - laparoscopic and potential  open techniques as well as scenarios where an ileocecectomy could be necessary. We discussed the material risks (including, but not limited to, pain, bleeding, infection, scarring, need for blood transfusion, damage to surrounding structures- blood vessels/nerves/viscus/organs, damage to ureter/bladder, urine leak, leak from staple line, need for additional procedures, hernia, recurrence although quite low with surgery, pneumonia, heart attack, stroke, death) benefits and alternatives to surgery were discussed. The patient's questions were answered to his satisfaction, he voiced understanding and has elected to proceed with surgery. Additionally, we discussed typical postoperative expectations and the recovery process.  Marin Olp, MD [facility] Use [email or web address] to contact on call provider

## 2020-08-29 NOTE — ED Notes (Signed)
Report given to [facility] RN at [facility] OR

## 2020-08-29 NOTE — Anesthesia Postprocedure Evaluation (Signed)
Anesthesia Post Note  Patient: Leonard Obrien  Procedure(s) Performed: APPENDECTOMY LAPAROSCOPIC (Abdomen)     Patient location during evaluation: PACU Anesthesia Type: General Level of consciousness: awake and alert Pain management: pain level controlled Vital Signs Assessment: post-procedure vital signs reviewed and stable Respiratory status: spontaneous breathing, nonlabored ventilation, respiratory function stable and patient connected to nasal cannula oxygen Cardiovascular status: blood pressure returned to baseline and stable Postop Assessment: no apparent nausea or vomiting Anesthetic complications: no   No notable events documented.  Last Vitals:  Vitals:   08/29/20 1655 08/29/20 1657  BP: (!) 147/104 (!) 167/93  Pulse: 84 78  Resp: 17 12  Temp:    SpO2: 97% 95%    Last Pain:  Vitals:   08/29/20 1650  TempSrc:   PainSc: 4                  Shelton Silvas

## 2020-08-30 ENCOUNTER — Encounter (HOSPITAL_COMMUNITY): Payer: Self-pay | Admitting: Surgery

## 2020-09-01 LAB — SURGICAL PATHOLOGY

## 2022-12-07 IMAGING — CT CT ABD-PELV W/ CM
2 of 4 series · 17 of 46 positions shown, 19 images · IV contrast (Omnipaque)
Comparison: None.

CLINICAL DATA: Acute left lower quadrant abdominal pain.

EXAM:
CT ABDOMEN AND PELVIS WITH CONTRAST
TECHNIQUE: Multidetector CT imaging of the abdomen and pelvis was performed
using the standard protocol following bolus administration of
intravenous contrast.
CONTRAST:  100mL OMNIPAQUE IOHEXOL 300 MG/ML  SOLN

[Series 2: axial st · axial · 0.98mm/px · z∈[-573,-113]mm · 14 of 100 slices shown, 16 images]
[im 4/100  soft-tissue]
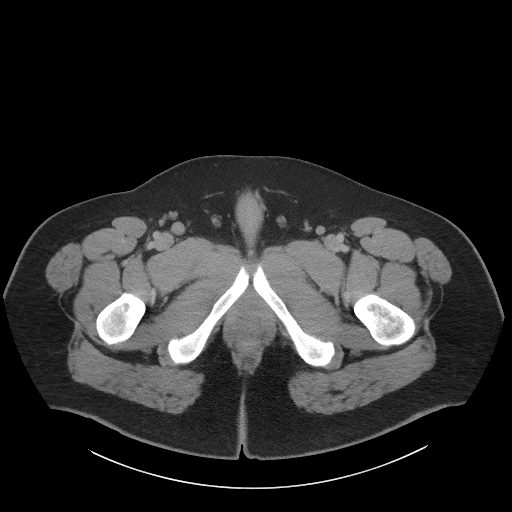
[im 4/100  bone]
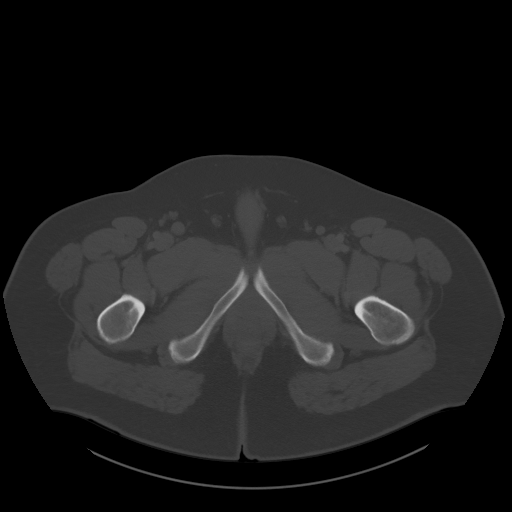
[im 12/100  soft-tissue]
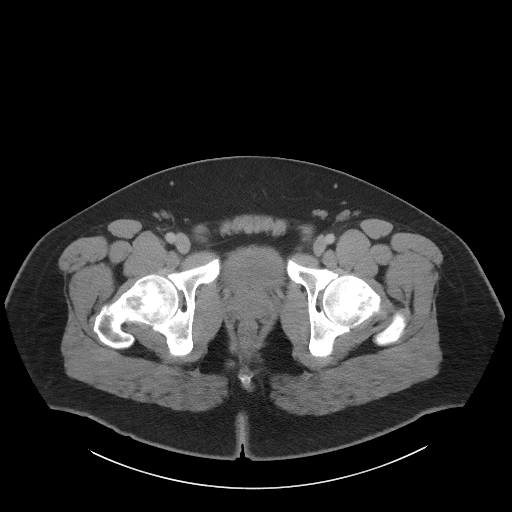
[im 20/100  soft-tissue]
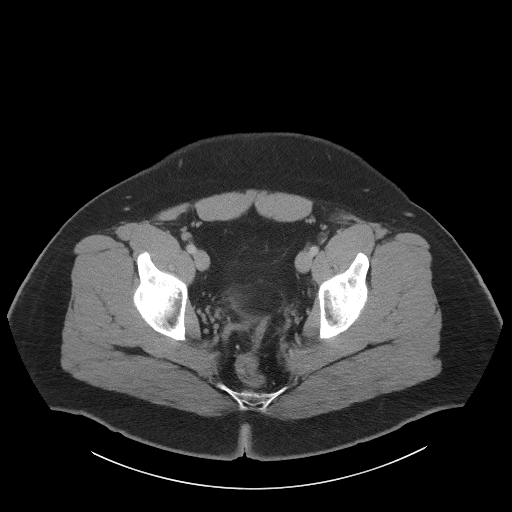
[im 27/100  soft-tissue]
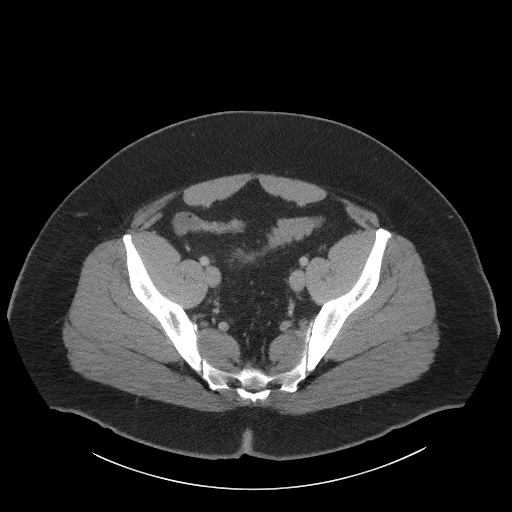
[im 35/100  soft-tissue]
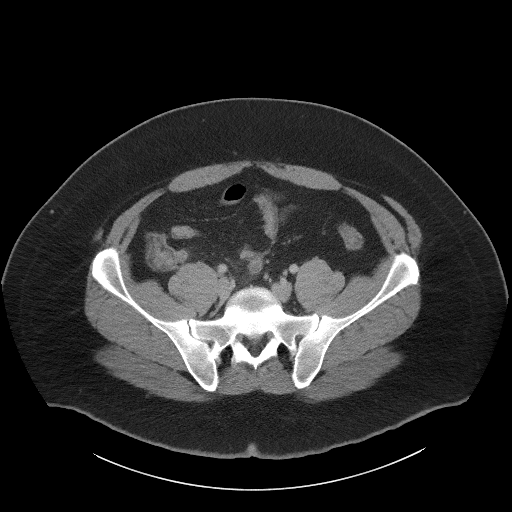
[im 39/100  soft-tissue]
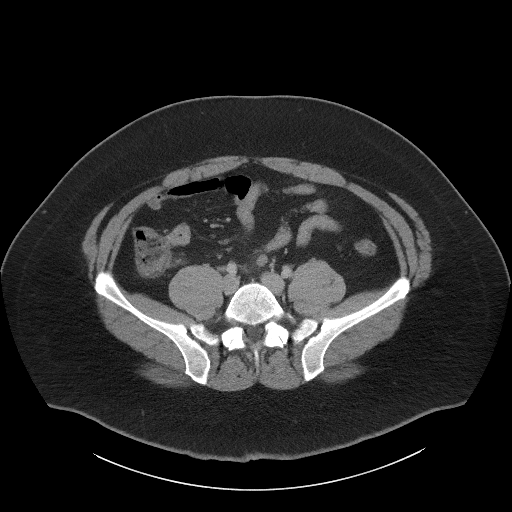
[im 46/100  soft-tissue]
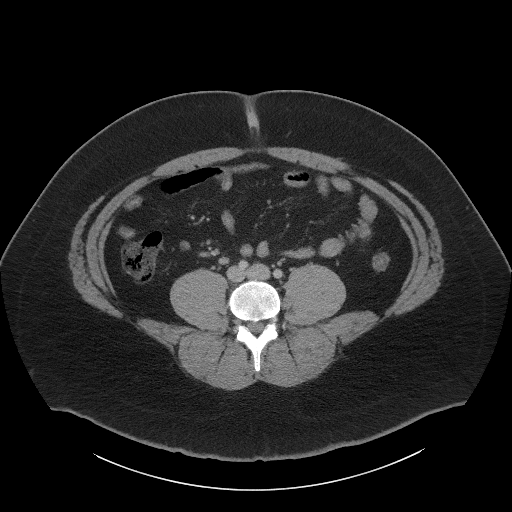
[im 54/100  soft-tissue]
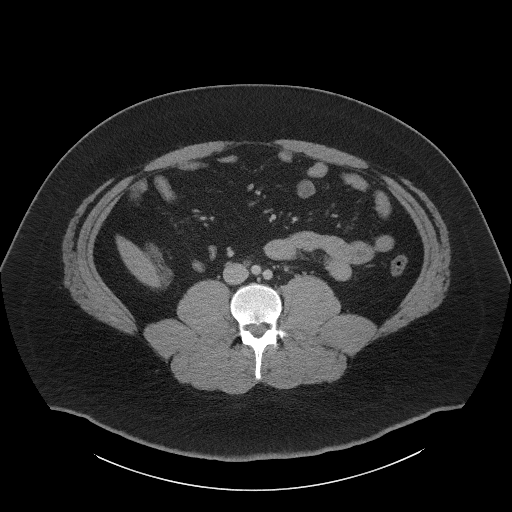
[im 61/100  soft-tissue]
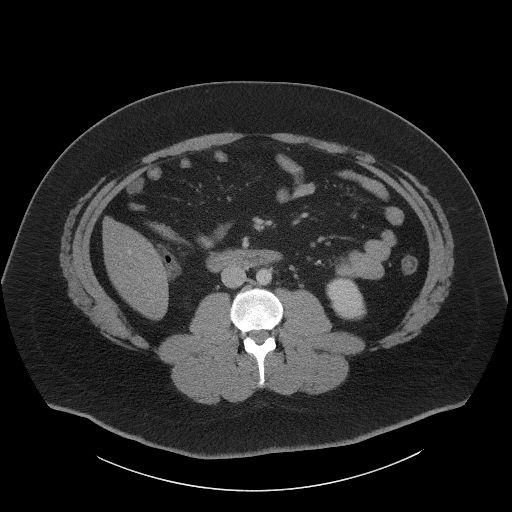
[im 61/100  bone]
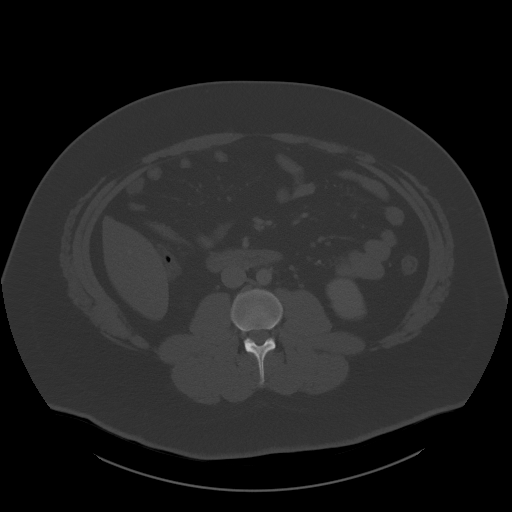
[im 65/100  soft-tissue]
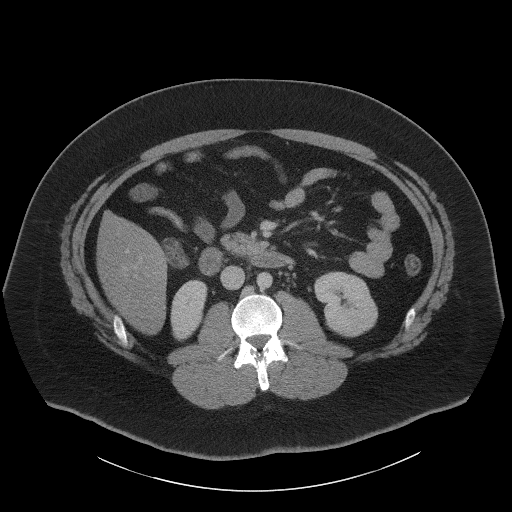
[im 73/100  soft-tissue]
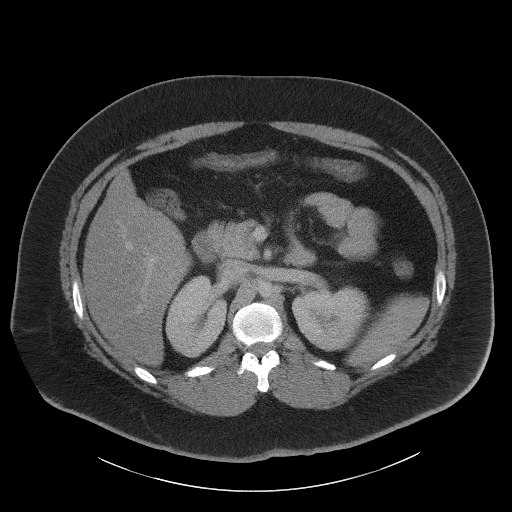
[im 80/100  soft-tissue]
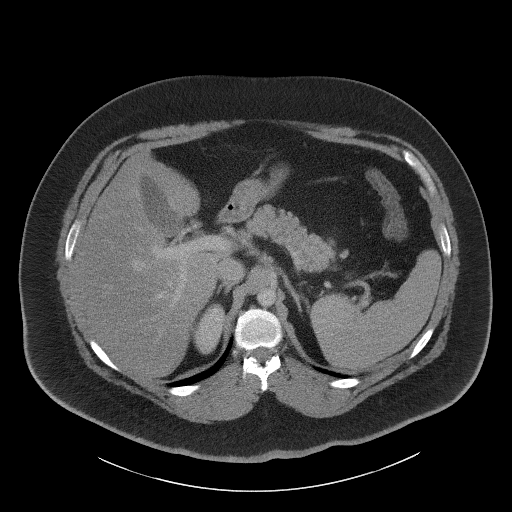
[im 88/100  soft-tissue]
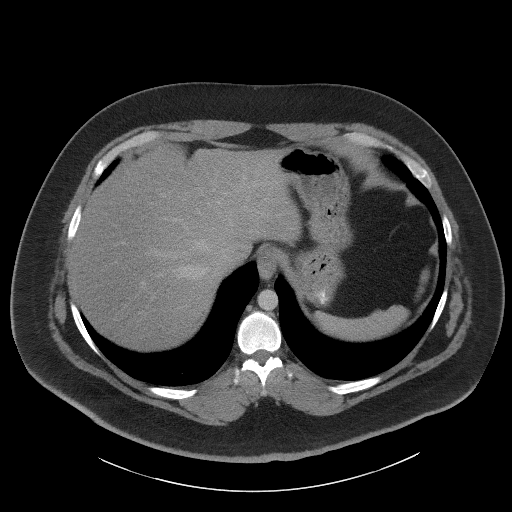
[im 96/100  soft-tissue]
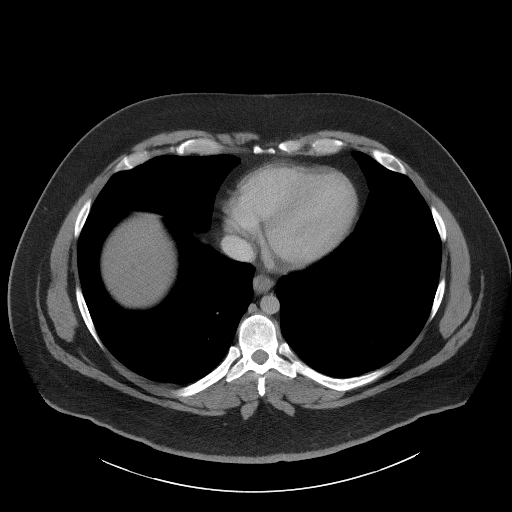

[Series 5: coronal st · coronal · 1.01mm/px · 3 of 127 slices shown]
[im 43/127  soft-tissue]
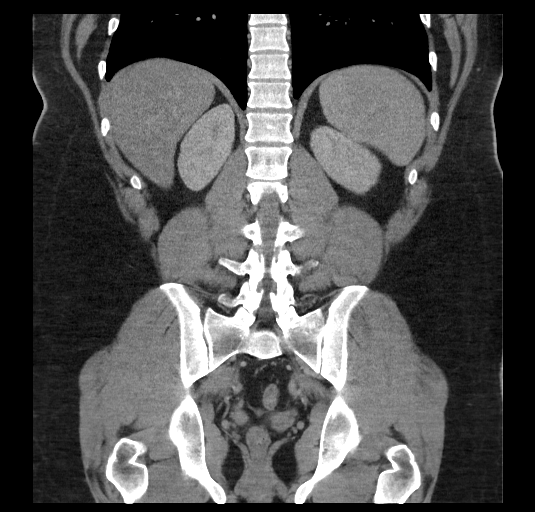
[im 57/127  soft-tissue]
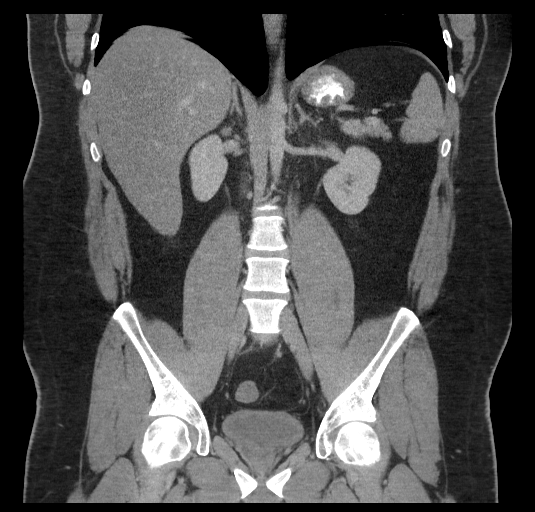
[im 71/127  soft-tissue]
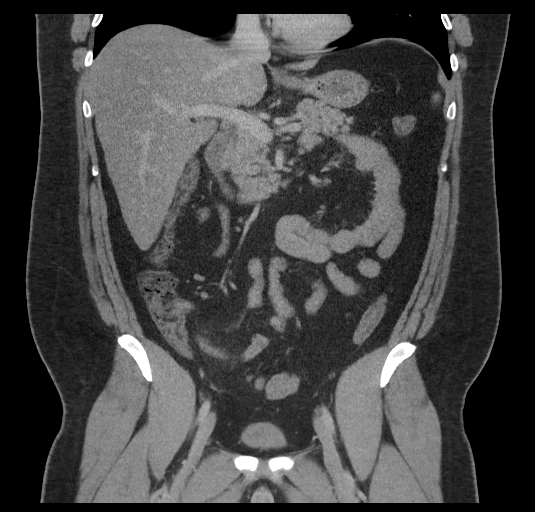

[17 of 46 positions shown; findings below may reference images not displayed]

FINDINGS: Lower chest: No acute abnormality.

Hepatobiliary: No gallstones or biliary dilatation is noted. Hepatic
steatosis.

Pancreas: Unremarkable. No pancreatic ductal dilatation or
surrounding inflammatory changes.

Spleen: Normal in size without focal abnormality.

Adrenals/Urinary Tract: Adrenal glands are unremarkable. Kidneys are
normal, without renal calculi, focal lesion, or hydronephrosis.
Bladder is unremarkable.

Stomach/Bowel: The stomach appears normal. There is no evidence of
bowel obstruction or inflammation. The appendix is enlarged and
inflamed consistent with acute appendicitis.

Appendix: Location: Right lower quadrant.

Diameter: 11 mm.

Appendicolith: No.

Mucosal hyper-enhancement: No.

Extraluminal gas: No.

Periappendiceal collection: No.

Vascular/Lymphatic: No significant vascular findings are present. No
enlarged abdominal or pelvic lymph nodes.

Reproductive: Prostate is unremarkable.

Other: No abdominal wall hernia or abnormality. No abdominopelvic
ascites.

Musculoskeletal: No acute or significant osseous findings.
IMPRESSION: Findings consistent with acute appendicitis. No definite abscess is
noted.

Hepatic steatosis.
